# Patient Record
Sex: Female | Born: 1995
Health system: Southern US, Community
[De-identification: ages and names within clinical notes are randomized; demographics above are authoritative.]

## PROBLEM LIST (undated history)

## (undated) DIAGNOSIS — H5213 Myopia, bilateral: Secondary | ICD-10-CM

## (undated) HISTORY — PX: WISDOM TOOTH EXTRACTION: SHX21

## (undated) HISTORY — DX: Myopia, bilateral: H52.13

## (undated) HISTORY — PX: IUD REMOVAL: SHX5392

---

## 2017-07-12 DIAGNOSIS — K080 Exfoliation of teeth due to systemic causes: Secondary | ICD-10-CM | POA: Diagnosis not present

## 2017-11-16 DIAGNOSIS — K010 Embedded teeth: Secondary | ICD-10-CM | POA: Diagnosis not present

## 2018-11-16 DIAGNOSIS — K080 Exfoliation of teeth due to systemic causes: Secondary | ICD-10-CM | POA: Diagnosis not present

## 2019-05-16 ENCOUNTER — Ambulatory Visit: Payer: Federal, State, Local not specified - PPO | Admitting: Family Medicine

## 2019-05-16 ENCOUNTER — Other Ambulatory Visit: Payer: Self-pay

## 2019-05-16 ENCOUNTER — Encounter: Payer: Self-pay | Admitting: Family Medicine

## 2019-05-16 VITALS — BP 112/79 | HR 78 | Temp 98.2°F | Resp 16 | Ht 63.5 in | Wt 115.0 lb

## 2019-05-16 DIAGNOSIS — L309 Dermatitis, unspecified: Secondary | ICD-10-CM | POA: Diagnosis not present

## 2019-05-16 DIAGNOSIS — Z23 Encounter for immunization: Secondary | ICD-10-CM | POA: Diagnosis not present

## 2019-05-16 DIAGNOSIS — Z0000 Encounter for general adult medical examination without abnormal findings: Secondary | ICD-10-CM | POA: Diagnosis not present

## 2019-05-16 MED ORDER — TRIAMCINOLONE ACETONIDE 0.5 % EX OINT: 1.0000 "application " | TOPICAL_OINTMENT | Freq: Two times a day (BID) | CUTANEOUS | 0 refills | Status: DC

## 2019-05-16 NOTE — Patient Instructions (Addendum)
Health Maintenance, Female Adopting a healthy lifestyle and getting preventive care can go a long way to promote health and wellness. Talk with your health care provider about what schedule of regular examinations is right for you. This is a good chance for you to check in with your provider about disease prevention and staying healthy. In between checkups, there are plenty of things you can do on your own. Experts have done a lot of research about which lifestyle changes and preventive measures are most likely to keep you healthy. Ask your health care provider for more information. Weight and diet Eat a healthy diet  Be sure to include plenty of vegetables, fruits, low-fat dairy products, and lean protein.  Do not eat a lot of foods high in solid fats, added sugars, or salt.  Get regular exercise. This is one of the most important things you can do for your health. ? Most adults should exercise for at least 150 minutes each week. The exercise should increase your heart rate and make you sweat (moderate-intensity exercise). ? Most adults should also do strengthening exercises at least twice a week. This is in addition to the moderate-intensity exercise. Maintain a healthy weight  Body mass index (BMI) is a measurement that can be used to identify possible weight problems. It estimates body fat based on height and weight. Your health care provider can help determine your BMI and help you achieve or maintain a healthy weight.  For females 20 years of age and older: ? A BMI below 18.5 is considered underweight. ? A BMI of 18.5 to 24.9 is normal. ? A BMI of 25 to 29.9 is considered overweight. ? A BMI of 30 and above is considered obese. Watch levels of cholesterol and blood lipids  You should start having your blood tested for lipids and cholesterol at 20 years of age, then have this test every 5 years.  You may need to have your cholesterol levels checked more often if: ? Your lipid or  cholesterol levels are high. ? You are older than 21 years of age. ? You are at high risk for heart disease. Cancer screening Lung Cancer  Lung cancer screening is recommended for adults 55-80 years old who are at high risk for lung cancer because of a history of smoking.  A yearly low-dose CT scan of the lungs is recommended for people who: ? Currently smoke. ? Have quit within the past 15 years. ? Have at least a 30-pack-year history of smoking. A pack year is smoking an average of one pack of cigarettes a day for 1 year.  Yearly screening should continue until it has been 15 years since you quit.  Yearly screening should stop if you develop a health problem that would prevent you from having lung cancer treatment. Breast Cancer  Practice breast self-awareness. This means understanding how your breasts normally appear and feel.  It also means doing regular breast self-exams. Let your health care provider know about any changes, no matter how small.  If you are in your 20s or 30s, you should have a clinical breast exam (CBE) by a health care provider every 1-3 years as part of a regular health exam.  If you are 40 or older, have a CBE every year. Also consider having a breast X-ray (mammogram) every year.  If you have a family history of breast cancer, talk to your health care provider about genetic screening.  If you are at high risk for breast cancer, talk   to your health care provider about having an MRI and a mammogram every year.  Breast cancer gene (BRCA) assessment is recommended for women who have family members with BRCA-related cancers. BRCA-related cancers include: ? Breast. ? Ovarian. ? Tubal. ? Peritoneal cancers.  Results of the assessment will determine the need for genetic counseling and BRCA1 and BRCA2 testing. Cervical Cancer Your health care provider may recommend that you be screened regularly for cancer of the pelvic organs (ovaries, uterus, and vagina).  This screening involves a pelvic examination, including checking for microscopic changes to the surface of your cervix (Pap test). You may be encouraged to have this screening done every 3 years, beginning at age 21.  For women ages 30-65, health care providers may recommend pelvic exams and Pap testing every 3 years, or they may recommend the Pap and pelvic exam, combined with testing for human papilloma virus (HPV), every 5 years. Some types of HPV increase your risk of cervical cancer. Testing for HPV may also be done on women of any age with unclear Pap test results.  Other health care providers may not recommend any screening for nonpregnant women who are considered low risk for pelvic cancer and who do not have symptoms. Ask your health care provider if a screening pelvic exam is right for you.  If you have had past treatment for cervical cancer or a condition that could lead to cancer, you need Pap tests and screening for cancer for at least 20 years after your treatment. If Pap tests have been discontinued, your risk factors (such as having a new sexual partner) need to be reassessed to determine if screening should resume. Some women have medical problems that increase the chance of getting cervical cancer. In these cases, your health care provider may recommend more frequent screening and Pap tests. Colorectal Cancer  This type of cancer can be detected and often prevented.  Routine colorectal cancer screening usually begins at 21 years of age and continues through 21 years of age.  Your health care provider may recommend screening at an earlier age if you have risk factors for colon cancer.  Your health care provider may also recommend using home test kits to check for hidden blood in the stool.  A small camera at the end of a tube can be used to examine your colon directly (sigmoidoscopy or colonoscopy). This is done to check for the earliest forms of colorectal cancer.  Routine  screening usually begins at age 50.  Direct examination of the colon should be repeated every 5-10 years through 21 years of age. However, you may need to be screened more often if early forms of precancerous polyps or small growths are found. Skin Cancer  Check your skin from head to toe regularly.  Tell your health care provider about any new moles or changes in moles, especially if there is a change in a mole's shape or color.  Also tell your health care provider if you have a mole that is larger than the size of a pencil eraser.  Always use sunscreen. Apply sunscreen liberally and repeatedly throughout the day.  Protect yourself by wearing long sleeves, pants, a wide-brimmed hat, and sunglasses whenever you are outside. Heart disease, diabetes, and high blood pressure  High blood pressure causes heart disease and increases the risk of stroke. High blood pressure is more likely to develop in: ? People who have blood pressure in the high end of the normal range (130-139/85-89 mm Hg). ? People   who are overweight or obese. ? People who are African American.  If you are 84-22 years of age, have your blood pressure checked every 3-5 years. If you are 67 years of age or older, have your blood pressure checked every year. You should have your blood pressure measured twice-once when you are at a hospital or clinic, and once when you are not at a hospital or clinic. Record the average of the two measurements. To check your blood pressure when you are not at a hospital or clinic, you can use: ? An automated blood pressure machine at a pharmacy. ? A home blood pressure monitor.  If you are between 52 years and 3 years old, ask your health care provider if you should take aspirin to prevent strokes.  Have regular diabetes screenings. This involves taking a blood sample to check your fasting blood sugar level. ? If you are at a normal weight and have a low risk for diabetes, have this test once  every three years after 20 years of age. ? If you are overweight and have a high risk for diabetes, consider being tested at a younger age or more often. Preventing infection Hepatitis B  If you have a higher risk for hepatitis B, you should be screened for this virus. You are considered at high risk for hepatitis B if: ? You were born in a country where hepatitis B is common. Ask your health care provider which countries are considered high risk. ? Your parents were born in a high-risk country, and you have not been immunized against hepatitis B (hepatitis B vaccine). ? You have HIV or AIDS. ? You use needles to inject street drugs. ? You live with someone who has hepatitis B. ? You have had sex with someone who has hepatitis B. ? You get hemodialysis treatment. ? You take certain medicines for conditions, including cancer, organ transplantation, and autoimmune conditions. Hepatitis C  Blood testing is recommended for: ? Everyone born from [birth year] through 1963. ? Anyone with known risk factors for hepatitis C. Sexually transmitted infections (STIs)  You should be screened for sexually transmitted infections (STIs) including gonorrhea and chlamydia if: ? You are sexually active and are younger than 21 years of age. ? You are older than 21 years of age and your health care provider tells you that you are at risk for this type of infection. ? Your sexual activity has changed since you were last screened and you are at an increased risk for chlamydia or gonorrhea. Ask your health care provider if you are at risk.  If you do not have HIV, but are at risk, it may be recommended that you take a prescription medicine daily to prevent HIV infection. This is called pre-exposure prophylaxis (PrEP). You are considered at risk if: ? You are sexually active and do not regularly use condoms or know the HIV status of your partner(s). ? You take drugs by injection. ? You are sexually active with a partner  who has HIV. Talk with your health care provider about whether you are at high risk of being infected with HIV. If you choose to begin PrEP, you should first be tested for HIV. You should then be tested every 3 months for as long as you are taking PrEP. Pregnancy  If you are premenopausal and you may become pregnant, ask your health care provider about preconception counseling.  If you may become pregnant, take 400 to 800 micrograms (mcg) of folic acid every  day.  If you want to prevent pregnancy, talk to your health care provider about birth control (contraception). Osteoporosis and menopause  Osteoporosis is a disease in which the bones lose minerals and strength with aging. This can result in serious bone fractures. Your risk for osteoporosis can be identified using a bone density scan.  If you are 3 years of age or older, or if you are at risk for osteoporosis and fractures, ask your health care provider if you should be screened.  Ask your health care provider whether you should take a calcium or vitamin D supplement to lower your risk for osteoporosis.  Menopause may have certain physical symptoms and risks.  Hormone replacement therapy may reduce some of these symptoms and risks. Talk to your health care provider about whether hormone replacement therapy is right for you. Follow these instructions at home:  Schedule regular health, dental, and eye exams.  Stay current with your immunizations.  Do not use any tobacco products including cigarettes, chewing tobacco, or electronic cigarettes.  If you are pregnant, do not drink alcohol.  If you are breastfeeding, limit how much and how often you drink alcohol.  Limit alcohol intake to no more than 1 drink per day for nonpregnant women. One drink equals 12 ounces of beer, 5 ounces of wine, or 1 ounces of hard liquor.  Do not use street drugs.  Do not share needles.  Ask your health care provider for help if you need support  or information about quitting drugs.  Tell your health care provider if you often feel depressed.  Tell your health care provider if you have ever been abused or do not feel safe at home. This information is not intended to replace advice given to you by your health care provider. Make sure you discuss any questions you have with your health care provider. Document Released: 06/01/2011 Document Revised: 04/23/2016 Document Reviewed: 08/20/2015 Elsevier Interactive Patient Education  2019 [place] Sex Practicing safe sex means taking steps before and during sex to reduce your risk of:  Getting an STD (sexually transmitted disease).  Giving your partner an STD.  Unwanted pregnancy. How can I practice safe sex? To practice safe sex:  Limit your sexual partners to only one partner who is having sex with only you.  Avoid using alcohol and recreational drugs before having sex. These substances can affect your judgment.  Before having sex with a new partner: ? Talk to your partner about past partners, past STDs, and drug use. ? You and your partner should be screened for STDs and discuss the results with each other.  Check your body regularly for sores, blisters, rashes, or unusual discharge. If you notice any of these problems, visit your health care provider.  If you have symptoms of an infection or you are being treated for an STD, avoid sexual contact.  While having sex, use a condom. Make sure to: ? Use a condom every time you have vaginal, oral, or anal sex. Both females and males should wear condoms during oral sex. ? Keep condoms in place from the beginning to the end of sexual activity. ? Use a latex condom, if possible. Latex condoms offer the best protection. ? Use only water-based lubricants or oils to lubricate a condom. Using petroleum-based lubricants or oils will weaken the condom and increase the chance that it will break.  See your health care provider  for regular screenings, exams, and tests for STDs.  Talk with  your health care provider about the form of birth control (contraception) that is best for you.  Get vaccinated against hepatitis B and human papillomavirus (HPV).  If you are at risk of being infected with HIV (human immunodeficiency virus), talk with your health care provider about taking a prescription medicine to prevent HIV infection. You are considered at risk for HIV if: ? You are a man who has sex with other men. ? You are a heterosexual man or woman who is sexually active with more than one partner. ? You take drugs by injection. ? You are sexually active with a partner who has HIV. This information is not intended to replace advice given to you by your health care provider. Make sure you discuss any questions you have with your health care provider. Document Released: 12/24/2004 Document Revised: 04/01/2016 Document Reviewed: 10/06/2015 Elsevier Interactive Patient Education  2019 [employer].

## 2019-05-16 NOTE — Progress Notes (Signed)
Patient ID: Martha Small, female  DOB: [birth year], 23 y.o.   MRN: [id number] Patient Care Team    Relationship Specialty Notifications Start End  Ma Hillock, DO PCP - General Family Medicine  05/16/19   Rutherford Guys, MD Consulting Physician Ophthalmology  05/16/19     Chief Complaint  Patient presents with  . Establish Care    [facility] peds previous PCP. Pt starts parademic school in the fall at [employer] and needs CPE to complete forms  Pt does not have forms yet from university    Subjective:  Martha Small is a 23 y.o.  female present for new patient establishment. All past medical history, surgical history, allergies, family history, immunizations, medications and social history were updated in the electronic medical record today. All recent labs, ED visits and hospitalizations within the last year were reviewed. Patient reports she has been on birth control pill supplied by her campus health clinic.  She has had STD screening completed at her campus as well. She reports her menstrual cycles are regular, once monthly on birth control pill her menses last approximately 4 days and is regular.  She has been sexually active, but not currently sexually active.  She reports a dry scaly itchy rash located on her left nipple.  She has tried putting different creams on the area without any benefit.  The patch remains without becoming worse or better.  It has been present for approximately 4 months.  Her mother has a history of breast cancer at age 68.  Patient does not recall the type of breast cancer her mother had.  Health maintenance:  Colonoscopy: no fhx, routine screen at 78 Mammogram: Mother breast CA at 5- early screening recommended.  Cervical cancer screening: PAP to start at 23. BCP use (provided by school's health clinic). Has been SA, but not currently.  Immunizations: tdap updated today, Influenza (encouraged yearly) Infectious disease screening: HIV declined,  urine cytology (STD screen) completed at her [facility] on campus where she gets her birth control. Assistive device: none Oxygen use: none Patient has a Dental home. Hospitalizations/ED visits:reviewed  Depression screen [facility] 2/9 05/16/2019  Decreased Interest 0  Down, Depressed, Hopeless 0  PHQ - 2 Score 0   No flowsheet data found.    No flowsheet data found.   Immunization History  Administered Date(s) Administered  . DTaP 12/23/1998, 02/21/1999, 04/30/1999, 01/26/2000, 12/20/2002  . Hepatitis A 12/11/2005, 01/21/2007  . Hepatitis B 04/30/1999, 07/23/1999, 10/29/1999  . HiB (PRP-OMP) 12/23/1998, 02/21/1999, 04/30/1999, 01/26/2000  . Hpv 05/20/2010, 06/30/2011, 07/07/2012  . IPV 12/23/1998, 02/21/1999, 07/23/1999, 12/20/2002  . MMR 10/29/1999, 12/20/2002  . Meningococcal Conjugate 05/20/2010  . Meningococcal Polysaccharide 07/17/2015  . Pneumococcal Conjugate-13 01/26/2000, 04/26/2000  . Td 05/20/2010, 05/16/2019  . Tdap 05/20/2010  . Varicella 10/29/1999, 01/21/2007     Hearing Screening   125Hz  250Hz  500Hz  1000Hz  2000Hz  3000Hz  4000Hz  6000Hz  8000Hz   Right ear:           Left ear:             Visual Acuity Screening   Right eye Left eye Both eyes  Without correction:     With correction: 20/25 20/25 20/25     Past Medical History:  Diagnosis Date  . History of frequent urinary tract infections   . Myopia of both eyes    No Known Allergies Past Surgical History:  Procedure Laterality Date  . WISDOM TOOTH EXTRACTION     Family History  Problem  Relation Age of Onset  . Breast cancer Mother 57  . Hypertension Mother   . Skin cancer Mother   . Hyperlipidemia Father   . Hypertension Father   . Arthritis Maternal Grandmother   . Skin cancer Maternal Grandfather   . Arthritis Paternal Grandmother   . Prostate cancer Paternal Grandfather   . Diabetes Paternal Grandfather   . Hypertension Paternal Grandfather   . Hyperlipidemia Paternal Grandfather   .  Heart disease Paternal Grandfather    Social History   Social History Narrative   Marital status/children/pets: Single.    Education/employment: Electronics engineer.    Safety:      -Wears a bicycle helmet riding a bike: Yes     -smoke alarm in the home:Yes     - wears seatbelt: Yes     - Feels safe in their relationships: Yes    Allergies as of 05/16/2019   No Known Allergies     Medication List       Accurate as of May 16, 2019 12:47 PM. If you have any questions, ask your nurse or doctor.        Tri-Previfem 0.18/0.215/0.25 MG-35 MCG tablet Generic drug: Norgestimate-Ethinyl Estradiol Triphasic   triamcinolone ointment 0.5 % Commonly known as: KENALOG Apply 1 application topically 2 (two) times daily. Started by: Howard Pouch, DO       All past medical history, surgical history, allergies, family history, immunizations andmedications were updated in the EMR today and reviewed under the history and medication portions of their EMR.    No results found for this or any previous visit (from the past 2160 hour(s)).  Patient was never admitted.   ROS: 14 pt review of systems performed and negative (unless mentioned in an HPI)  Objective: BP 112/79 (BP Location: Left Arm, Patient Position: Sitting, Cuff Size: Normal)   Pulse 78   Temp 98.2 F (36.8 C) (Oral)   Resp 16   Ht 5' 3.5" (1.613 m)   Wt 115 lb (52.2 kg)   LMP 04/25/2019 (Exact Date)   SpO2 99%   BMI 20.05 kg/m  Gen: Afebrile. No acute distress. Nontoxic in appearance, well-developed, well-nourished, nontoxic, pleasant, Caucasian female. HENT: AT. [place]. Bilateral TM visualized and normal in appearance, normal external auditory canal. MMM, no oral lesions, adequate dentition. Bilateral nares within normal limits. Throat without erythema, ulcerations or exudates.  No cough on exam, no  hoarseness on exam. Eyes:Pupils Equal Round Reactive to light, Extraocular movements intact,  Conjunctiva without redness,  discharge or icterus. Neck/lymp/endocrine: Supple, no lymphadenopathy, no thyromegaly CV: RRR no murmur, no edema, +2/4 P posterior tibialis pulses.  Chest: CTAB, no wheeze, rhonchi or crackles.  Normal respiratory effort.  Good air movement. Abd: Soft.  Flat. NTND. BS present.  No masses palpated. No hepatosplenomegaly. No rebound tenderness or guarding. Skin: Dime size patch of scaly skin left areolar 12 o'clock position., purpura or petechiae. Warm and well-perfused. Skin intact. Neuro/Msk: Normal gait. PERLA. EOMi. Alert. Oriented x3.  Cranial nerves II through XII intact. Muscle strength 5/5 upper/lower extremity. DTRs equal bilaterally. Psych: Normal affect, dress and demeanor. Normal speech. Normal thought content and judgment.   Hearing Screening   125Hz  250Hz  500Hz  1000Hz  2000Hz  3000Hz  4000Hz  6000Hz  8000Hz   Right ear:           Left ear:             Visual Acuity Screening   Right eye Left eye Both eyes  Without correction:  With correction: 20/25 20/25 20/25      Assessment/plan: Annaelle Kasel is a 23 y.o. female present for establish care Encounter for preventive health examination Patient was encouraged to exercise greater than 150 minutes a week. Patient was encouraged to choose a diet filled with fresh fruits and vegetables, and lean meats. AVS provided to patient today for education/recommendation on gender specific health and safety maintenance. Body mass index is 20.05 kg/m.  Colonoscopy: no fhx, routine screen at 73 Mammogram: Mother breast CA at 46- early screening recommended.  Cervical cancer screening: PAP to start at 23. BCP use (provided by school's health clinic). Has been SA, but not currently.  Immunizations: tdap updated today, Influenza (encouraged yearly) Infectious disease screening: HIV declined, urine cytology (STD screen) completed at her [facility] clinic on campus where she gets her birth control. - Visual acuity screening Need for Td vaccine -  Td vaccine greater than or equal to 7yo preservative free IM Eczema, unspecified type - rash at 12 o'clock position of left areole or region present for 4 months.  Discussed differential diagnosis with patient today either bacterial, fungal or inflammatory versus less likely cancerous causes for her age.  However her mother did have a history of breast cancer at age 77. -All of triamcinolone ointment for 2 weeks, if not seeing improvement patient is to follow-up and we will refer to gynecology if appropriate.    Return in about 1 year (around 05/15/2020) for CPE (30 min).   Note is dictated utilizing voice recognition software. Although note has been proof read prior to signing, occasional typographical errors still can be missed. If any questions arise, please do not hesitate to call for verification.  Electronically signed by: Howard Pouch, DO [place]

## 2019-06-08 ENCOUNTER — Telehealth: Payer: Self-pay | Admitting: Family Medicine

## 2019-06-08 NOTE — Telephone Encounter (Signed)
Pt had CPE 05/20/2019. Pt needs a two step TB test and has forms she needs to drop off that need to be filled out. Pt was told depending on what was on the forms she may need to come back. She verbalized understanding.

## 2019-06-08 NOTE — Telephone Encounter (Signed)
Needs TB skin test for school. Does not have form to complete. It needs to be under skin test, checked and then test repeated. Is this something we can do?  Office visit or no office visit?  She is also inquiring about a form we would complete saying the test was negative or positive.  Please advise and contact.  [phone]

## 2019-06-09 ENCOUNTER — Telehealth: Payer: Self-pay

## 2019-06-09 NOTE — Telephone Encounter (Signed)
She can come in Monday- have it read 48-72 hours later. 2nd PPD 7 days after first and read 48-72 hours later.

## 2019-06-09 NOTE — Telephone Encounter (Signed)
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 Pt wants to come Monday morning for first TB test since she does have to have two done. NP/CPE done on 05/16/2019. Please advise if this is okay and how long she has to wait between each TB test.

## 2019-06-12 ENCOUNTER — Telehealth: Payer: Self-pay

## 2019-06-12 ENCOUNTER — Other Ambulatory Visit: Payer: Self-pay

## 2019-06-12 ENCOUNTER — Ambulatory Visit (INDEPENDENT_AMBULATORY_CARE_PROVIDER_SITE_OTHER): Payer: Federal, State, Local not specified - PPO

## 2019-06-12 DIAGNOSIS — Z111 Encounter for screening for respiratory tuberculosis: Secondary | ICD-10-CM | POA: Diagnosis not present

## 2019-06-12 NOTE — Telephone Encounter (Signed)
Returned form to LPN. No attachment was with form as it stated on the sheet.  Return after form is supplied and her 2 step PPD is read.

## 2019-06-12 NOTE — Telephone Encounter (Signed)
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 Placed of Dr Raoul Pitch desk for signature. Pt did get 1 out of 2 step TB test this AM. Last OV/CPE was 05/16/2019.

## 2019-06-12 NOTE — Telephone Encounter (Signed)
Martha Small. Please let pt know when she comes today to get TB test.  Thanks

## 2019-06-12 NOTE — Telephone Encounter (Signed)
Noted.  Patient scheduled 2nd tb read at her visit today.

## 2019-06-15 ENCOUNTER — Ambulatory Visit: Payer: Federal, State, Local not specified - PPO

## 2019-06-15 ENCOUNTER — Other Ambulatory Visit: Payer: Self-pay

## 2019-06-15 LAB — TB SKIN TEST
Induration: 0 mm
TB Skin Test: NEGATIVE

## 2019-06-16 NOTE — Telephone Encounter (Signed)
Attachment was placed with form and put on Dr Dierdre Highman desk.

## 2019-06-19 NOTE — Telephone Encounter (Signed)
Called patient. She will come by and pick up the signed paper on Wednesday.  Paperwork has been placed up front.

## 2019-06-19 NOTE — Telephone Encounter (Signed)
Completed and placed on assistants desk.

## 2019-06-21 ENCOUNTER — Ambulatory Visit (INDEPENDENT_AMBULATORY_CARE_PROVIDER_SITE_OTHER): Payer: Federal, State, Local not specified - PPO

## 2019-06-21 ENCOUNTER — Other Ambulatory Visit: Payer: Self-pay

## 2019-06-21 DIAGNOSIS — Z111 Encounter for screening for respiratory tuberculosis: Secondary | ICD-10-CM | POA: Diagnosis not present

## 2019-06-21 DIAGNOSIS — Z227 Latent tuberculosis: Secondary | ICD-10-CM

## 2019-06-21 NOTE — Progress Notes (Addendum)
Please sign off on PPD in PCP absence, thanks.  PPD result = ZERO mm.  Signed:  Crissie Sickles, MD           06/21/2019

## 2019-06-23 LAB — TB SKIN TEST
Induration: 0 mm
TB Skin Test: NEGATIVE

## 2019-07-04 ENCOUNTER — Ambulatory Visit (INDEPENDENT_AMBULATORY_CARE_PROVIDER_SITE_OTHER): Payer: Federal, State, Local not specified - PPO | Admitting: Family Medicine

## 2019-07-04 ENCOUNTER — Telehealth: Payer: Self-pay

## 2019-07-04 ENCOUNTER — Other Ambulatory Visit: Payer: Self-pay

## 2019-07-04 ENCOUNTER — Encounter: Payer: Self-pay | Admitting: Family Medicine

## 2019-07-04 ENCOUNTER — Ambulatory Visit (INDEPENDENT_AMBULATORY_CARE_PROVIDER_SITE_OTHER): Payer: Federal, State, Local not specified - PPO

## 2019-07-04 VITALS — Temp 98.4°F

## 2019-07-04 DIAGNOSIS — Z0184 Encounter for antibody response examination: Secondary | ICD-10-CM | POA: Diagnosis not present

## 2019-07-04 DIAGNOSIS — Z1159 Encounter for screening for other viral diseases: Secondary | ICD-10-CM

## 2019-07-04 NOTE — Progress Notes (Signed)
   VIRTUAL VISIT VIA VIDEO  I connected with Martha Small on 07/04/19 at 10:00 AM EDT by a video enabled telemedicine application and verified that I am speaking with the correct person using two identifiers. Location patient: Home Location provider: [place], Office Persons participating in the virtual visit: Patient, Dr. Kuneff and R.Baker, LPN  I discussed the limitations of evaluation and management by telemedicine and the availability of in person appointments. The patient expressed understanding and agreed to proceed.   SUBJECTIVE Chief Complaint  Patient presents with  . titers    Pt needs titers drawn for school- MMR, chicken pox, Hep B     HPI: Martha Small is a 22 y.o. female present for immunity status testing.  She is leaving for school on Friday and despite having records of having hepatitis B vaccines x3, varicella vaccinesx2 and MMR vaccinesx2 for school is asking her to have titers drawn of each.  ROS: See pertinent positives and negatives per HPI.  Patient Active Problem List   Diagnosis Date Noted  . Eczema 05/16/2019    Social History   Tobacco Use  . Smoking status: Never Smoker  . Smokeless tobacco: Never Used  Substance Use Topics  . Alcohol use: Never    Frequency: Never    Current Outpatient Medications:  .  Norgestimate-Ethinyl Estradiol Triphasic (TRI-PREVIFEM) 0.18/0.215/0.25 MG-35 MCG tablet, , Disp: , Rfl:  .  triamcinolone ointment (KENALOG) 0.5 %, Apply 1 application topically 2 (two) times daily., Disp: 30 g, Rfl: 0  No Known Allergies  OBJECTIVE: Temp 98.4 F (36.9 C) (Temporal)  Gen: No acute distress. Nontoxic in appearance.  HENT: AT. [place].  MMM.  Eyes:Pupils Equal Round Reactive to light, Extraocular movements intact,  Conjunctiva without redness, discharge or icterus. Chest: Cough or shortness of breath not present  Neuro:  Alert. Oriented x3   ASSESSMENT AND PLAN: Martha Small is a 22 y.o. female present  for  Immunity status testing/hepatitis testing -Patient will be scheduled for lab this afternoon in hopes to get all reports back in time to be sent to her school by Friday. - Measles/Mumps/Rubella Immunity; Future - Varicella zoster antibody, IgG; Future - Hbs and Hbsag future.  - results will be posted to mychart and if desired can be emailed to patient.    > 15 minutes spent with patient, > 50% of that time face to face    Renee Kuneff, DO 07/04/2019   

## 2019-07-04 NOTE — Telephone Encounter (Signed)
Pt left VM asking if she can schedule lab appt to have her titers checked for MMR, chicken pox, and  Hep C for school. 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 She is able to do a VV and then come in to just have the labs completed. Please advise if we can do this today.

## 2019-07-05 LAB — VARICELLA ZOSTER ANTIBODY, IGG: Varicella IgG: 185.7 index

## 2019-07-05 LAB — MEASLES/MUMPS/RUBELLA IMMUNITY
Mumps IgG: 12.7 AU/mL
Rubella: <0.9 index — ABNORMAL LOW
Rubeola IgG: 37.7 AU/mL

## 2019-07-05 LAB — HEPATITIS B SURFACE ANTIGEN: Hepatitis B Surface Ag: NONREACTIVE

## 2019-07-05 LAB — HEPATITIS B SURFACE ANTIBODY,QUALITATIVE: Hep B S Ab: NONREACTIVE

## 2019-07-06 ENCOUNTER — Other Ambulatory Visit: Payer: Self-pay

## 2019-07-06 ENCOUNTER — Ambulatory Visit (INDEPENDENT_AMBULATORY_CARE_PROVIDER_SITE_OTHER): Payer: Federal, State, Local not specified - PPO

## 2019-07-06 DIAGNOSIS — Z23 Encounter for immunization: Secondary | ICD-10-CM

## 2019-07-06 DIAGNOSIS — Z9229 Personal history of other drug therapy: Secondary | ICD-10-CM

## 2019-07-06 NOTE — Progress Notes (Addendum)
Martha Small 23 y.o. female presents to office today for Hep B and MMR vaccine. Orders per PCP, Renee Kuneff, DO. Administered ENGERIX-B 1 mL IM right arm. Administered MMR 0.5 mL SubCut left arm. Patient tolerated well. Stated that she will continue her Hep B series at [employer].   Medical screening examination/treatment/procedure(s) were performed by non-physician practitioner and as supervising physician I was immediately available for consultation/collaboration.  I agree with above assessment and plan.  Electronically Signed by: Howard Pouch, DO [place] [facility] [place]

## 2019-07-20 ENCOUNTER — Telehealth: Payer: Self-pay

## 2019-07-20 NOTE — Telephone Encounter (Signed)
Pt called and LM on nurse line needing the date her TB test was given and the date that it was read. The 06/21/19 TB test and letter is correct but the 05/1319 TB test and read date is not correct. Pt needs this corrected and sent to her.

## 2019-07-21 ENCOUNTER — Encounter: Payer: Self-pay | Admitting: Family Medicine

## 2019-07-21 NOTE — Telephone Encounter (Signed)
Letter was fixed and emailed to pt. Pt was called, email confirmed. Pt verbalized understanding

## 2019-12-08 ENCOUNTER — Other Ambulatory Visit: Payer: Self-pay

## 2019-12-11 ENCOUNTER — Encounter: Payer: Self-pay | Admitting: Women's Health

## 2019-12-11 ENCOUNTER — Other Ambulatory Visit: Payer: Self-pay

## 2019-12-11 ENCOUNTER — Ambulatory Visit (INDEPENDENT_AMBULATORY_CARE_PROVIDER_SITE_OTHER): Payer: Federal, State, Local not specified - PPO | Admitting: Women's Health

## 2019-12-11 VITALS — BP 118/76 | Ht 62.0 in | Wt 111.0 lb

## 2019-12-11 DIAGNOSIS — Z01419 Encounter for gynecological examination (general) (routine) without abnormal findings: Secondary | ICD-10-CM

## 2019-12-11 MED ORDER — NORGESTIM-ETH ESTRAD TRIPHASIC 0.18/0.215/0.25 MG-35 MCG PO TABS: 1.0000 | ORAL_TABLET | Freq: Every day | ORAL | 4 refills | Status: DC

## 2019-12-11 NOTE — Patient Instructions (Signed)
Good to meet you! MVI daily Health Maintenance, Female Adopting a healthy lifestyle and getting preventive care are important in promoting health and wellness. Ask your health care provider about:  The right schedule for you to have regular tests and exams.  Things you can do on your own to prevent diseases and keep yourself healthy. What should I know about diet, weight, and exercise? Eat a healthy diet   Eat a diet that includes plenty of vegetables, fruits, low-fat dairy products, and lean protein.  Do not eat a lot of foods that are high in solid fats, added sugars, or sodium. Maintain a healthy weight Body mass index (BMI) is used to identify weight problems. It estimates body fat based on height and weight. Your health care provider can help determine your BMI and help you achieve or maintain a healthy weight. Get regular exercise Get regular exercise. This is one of the most important things you can do for your health. Most adults should:  Exercise for at least 150 minutes each week. The exercise should increase your heart rate and make you sweat (moderate-intensity exercise).  Do strengthening exercises at least twice a week. This is in addition to the moderate-intensity exercise.  Spend less time sitting. Even light physical activity can be beneficial. Watch cholesterol and blood lipids Have your blood tested for lipids and cholesterol at 22 years of age, then have this test every 5 years. Have your cholesterol levels checked more often if:  Your lipid or cholesterol levels are high.  You are older than 22 years of age.  You are at high risk for heart disease. What should I know about cancer screening? Depending on your health history and family history, you may need to have cancer screening at various ages. This may include screening for:  Breast cancer.  Cervical cancer.  Colorectal cancer.  Skin cancer.  Lung cancer. What should I know about heart disease,  diabetes, and high blood pressure? Blood pressure and heart disease  High blood pressure causes heart disease and increases the risk of stroke. This is more likely to develop in people who have high blood pressure readings, are of African descent, or are overweight.  Have your blood pressure checked: ? Every 3-5 years if you are 18-39 years of age. ? Every year if you are 40 years old or older. Diabetes Have regular diabetes screenings. This checks your fasting blood sugar level. Have the screening done:  Once every three years after age 40 if you are at a normal weight and have a low risk for diabetes.  More often and at a younger age if you are overweight or have a high risk for diabetes. What should I know about preventing infection? Hepatitis B If you have a higher risk for hepatitis B, you should be screened for this virus. Talk with your health care provider to find out if you are at risk for hepatitis B infection. Hepatitis C Testing is recommended for:  Everyone born from 1945 through 1965.  Anyone with known risk factors for hepatitis C. Sexually transmitted infections (STIs)  Get screened for STIs, including gonorrhea and chlamydia, if: ? You are sexually active and are younger than 22 years of age. ? You are older than 22 years of age and your health care provider tells you that you are at risk for this type of infection. ? Your sexual activity has changed since you were last screened, and you are at increased risk for chlamydia or gonorrhea.   gonorrhea. Ask your health care provider if you are at risk.  Ask your health care provider about whether you are at high risk for HIV. Your health care provider may recommend a prescription medicine to help prevent HIV infection. If you choose to take medicine to prevent HIV, you should first get tested for HIV. You should then be tested every 3 months for as long as you are taking the medicine. Pregnancy  If you are about to stop having your  period (premenopausal) and you may become pregnant, seek counseling before you get pregnant.  Take 400 to 800 micrograms (mcg) of folic acid every day if you become pregnant.  Ask for birth control (contraception) if you want to prevent pregnancy. Osteoporosis and menopause Osteoporosis is a disease in which the bones lose minerals and strength with aging. This can result in bone fractures. If you are 65 years old or older, or if you are at risk for osteoporosis and fractures, ask your health care provider if you should:  Be screened for bone loss.  Take a calcium or vitamin D supplement to lower your risk of fractures.  Be given hormone replacement therapy (HRT) to treat symptoms of menopause. Follow these instructions at home: Lifestyle  Do not use any products that contain nicotine or tobacco, such as cigarettes, e-cigarettes, and chewing tobacco. If you need help quitting, ask your health care provider.  Do not use street drugs.  Do not share needles.  Ask your health care provider for help if you need support or information about quitting drugs. Alcohol use  Do not drink alcohol if: ? Your health care provider tells you not to drink. ? You are pregnant, may be pregnant, or are planning to become pregnant.  If you drink alcohol: ? Limit how much you use to 0-1 drink a day. ? Limit intake if you are breastfeeding.  Be aware of how much alcohol is in your drink. In the [place] one drink equals one 12 oz bottle of beer (355 mL), one 5 oz glass of wine (148 mL), or one 1 oz glass of hard liquor (44 mL). General instructions  Schedule regular health, dental, and eye exams.  Stay current with your vaccines.  Tell your health care provider if: ? You often feel depressed. ? You have ever been abused or do not feel safe at home. Summary  Adopting a healthy lifestyle and getting preventive care are important in promoting health and wellness.  Follow your health care provider's  instructions about healthy diet, exercising, and getting tested or screened for diseases.  Follow your health care provider's instructions on monitoring your cholesterol and blood pressure. This information is not intended to replace advice given to you by your health care provider. Make sure you discuss any questions you have with your health care provider. Document Revised: 11/09/2018 Document Reviewed: 11/09/2018 Elsevier Patient Education  2020 Elsevier Inc.  

## 2019-12-11 NOTE — Progress Notes (Signed)
Martha Small [birth year] [id number]    History: 24 yo SWF G0 presents for annual exam. Monthly cycle on Tri-Previfem. Not sexually active, denies need for STD screening. Sexually active 1st partner 3 years ago, none since. Received Gardasil. Denies vaginal spotting, dysmenorrhea, menorrhagia, itching, burning.   Past medical history, past surgical history, family history and social history were all reviewed and documented in the EPIC chart. 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 Healthy younger sister. Mom breast cancer survivor at 48 years, BRCA negative.   ROS:  A ROS was performed and pertinent positives and negatives are included.   Exam:  Vitals:   12/11/19 1434  BP: 118/76  Weight: 111 lb (50.3 kg)  Height: _0  (1.575 m)   Body mass index is 20.3 kg/m.   General appearance:  Normal Thyroid:  Symmetrical, normal in size, without palpable masses or nodularity. Respiratory  Auscultation:  Clear without wheezing or rhonchi Cardiovascular  Auscultation:  Regular rate, without rubs, murmurs or gallops  Edema/varicosities:  Not grossly evident Abdominal  Soft,nontender, without masses, guarding or rebound.  Liver/spleen:  No organomegaly noted  Hernia:  None appreciated  Skin  Inspection:  Grossly normal   Breasts: Examined lying and sitting.     Right: Without masses, retractions, discharge or axillary adenopathy.    Left: Without masses, retractions, discharge or axillary adenopathy. Gentitourinary   Inguinal/mons:  Normal without inguinal adenopathy  External genitalia:  Normal  BUS/Urethra/Skene's glands:  Normal  Vagina:  Normal  Cervix:  Normal  Uterus:  normal in size, shape and contour.  Midline and mobile  Adnexa/parametria:     Rt: Without masses or tenderness.   Lt: Without masses or tenderness.  Anus and perineum: Normal   Assessment/Plan:  24 y.o. SWF G0 for annual exam with no complaints.   Monthly cycle on  Tri-Previfem[facility]   Plan: Refill for Tri-Sprintec given, slight risk for blood clots and strokes reviewed.  Condoms encouraged if becomes sexually active.  Pap and CBC today. Continue active lifestyle, healthy diet, and daily MVI.     [place], 3:04 PM 12/11/2019

## 2019-12-13 LAB — PAP IG W/ RFLX HPV ASCU

## 2019-12-18 DIAGNOSIS — Z01110 Encounter for hearing examination following failed hearing screening: Secondary | ICD-10-CM | POA: Diagnosis not present

## 2019-12-18 DIAGNOSIS — Z20822 Contact with and (suspected) exposure to covid-19: Secondary | ICD-10-CM | POA: Diagnosis not present

## 2019-12-18 DIAGNOSIS — Z1152 Encounter for screening for COVID-19: Secondary | ICD-10-CM | POA: Diagnosis not present

## 2019-12-18 DIAGNOSIS — Z20828 Contact with and (suspected) exposure to other viral communicable diseases: Secondary | ICD-10-CM | POA: Diagnosis not present

## 2020-01-25 DIAGNOSIS — Z23 Encounter for immunization: Secondary | ICD-10-CM | POA: Diagnosis not present

## 2020-04-22 ENCOUNTER — Other Ambulatory Visit: Payer: Self-pay

## 2020-04-22 ENCOUNTER — Encounter: Payer: Self-pay | Admitting: Nurse Practitioner

## 2020-04-22 ENCOUNTER — Ambulatory Visit: Payer: Federal, State, Local not specified - PPO | Admitting: Nurse Practitioner

## 2020-04-22 VITALS — BP 118/80

## 2020-04-22 DIAGNOSIS — Z803 Family history of malignant neoplasm of breast: Secondary | ICD-10-CM | POA: Diagnosis not present

## 2020-04-22 DIAGNOSIS — N644 Mastodynia: Secondary | ICD-10-CM

## 2020-04-22 DIAGNOSIS — N6311 Unspecified lump in the right breast, upper outer quadrant: Secondary | ICD-10-CM | POA: Diagnosis not present

## 2020-04-22 NOTE — Progress Notes (Signed)
   Acute Office Visit  Subjective:    Patient ID: Martha Small, female    DOB: 19-Apr-1996, 24 y.o.   MRN: [id number]  Chief Complaint  Patient presents with  . breast exam    severity 4    HPI Presents today for right breast lump that she noticed about 2 weeks ago. It is tender to palpation without swelling or redness, denies nipple discharge. Mother with history of breast cancer at age 75, survivor.    Review of Systems  Constitutional: Negative.   Respiratory: Negative.   Cardiovascular: Negative.   Breast: right: tenderness, denies nipple discharge, swelling, or redness Left: normal     Objective:    Physical Exam Constitutional:      Appearance: Normal appearance.  Chest:     Breasts:        Right: Mass (at 11 o'clock) and tenderness (to palpation ) present. No swelling, bleeding, inverted nipple, nipple discharge or skin change.        Left: Normal.    Lymphadenopathy:     Upper Body:     Right upper body: No supraclavicular or axillary adenopathy.     Left upper body: No supraclavicular or axillary adenopathy.     BP 118/80 (BP Location: Right Arm, Patient Position: Sitting, Cuff Size: Normal)   LMP 03/24/2020  Wt Readings from Last 3 Encounters:  12/11/19 111 lb (50.3 kg)  05/16/19 115 lb (52.2 kg)        Assessment & Plan:   Problem List Items Addressed This Visit    None    Visit Diagnoses    Breast pain, right    -  Primary   Breast lump on right side at 11 o'clock position         Plan: Most likely a fibroadenoma. Will schedule a diagnostic ultrasound of right breast due to mother's history of breast cancer.      [name] [facility], 2:31 PM 04/22/2020

## 2020-04-23 ENCOUNTER — Telehealth: Payer: Self-pay | Admitting: *Deleted

## 2020-04-23 DIAGNOSIS — N6310 Unspecified lump in the right breast, unspecified quadrant: Secondary | ICD-10-CM

## 2020-04-23 DIAGNOSIS — N6311 Unspecified lump in the right breast, upper outer quadrant: Secondary | ICD-10-CM

## 2020-04-23 NOTE — Telephone Encounter (Signed)
Patient scheduled at breast center on 04/24/20 @ 12:10pm. Patient informed.

## 2020-04-23 NOTE — Telephone Encounter (Signed)
-----   Message from [name], NP sent at 04/22/2020  2:18 PM EDT ----- Please schedule diagnostic ultrasound for right breast lump. Mother breast cancer at age 22. Thank  you

## 2020-04-24 ENCOUNTER — Ambulatory Visit
Admission: RE | Admit: 2020-04-24 | Discharge: 2020-04-24 | Disposition: A | Discharge status: Home / Self Care | Payer: Federal, State, Local not specified - PPO | Source: Ambulatory Visit | Attending: Nurse Practitioner | Admitting: Nurse Practitioner

## 2020-04-24 ENCOUNTER — Other Ambulatory Visit: Payer: Self-pay

## 2020-04-24 DIAGNOSIS — N6311 Unspecified lump in the right breast, upper outer quadrant: Secondary | ICD-10-CM

## 2020-04-24 DIAGNOSIS — N6489 Other specified disorders of breast: Secondary | ICD-10-CM | POA: Diagnosis not present

## 2020-05-20 ENCOUNTER — Encounter: Payer: Federal, State, Local not specified - PPO | Admitting: Family Medicine

## 2020-06-19 ENCOUNTER — Other Ambulatory Visit: Payer: Self-pay

## 2020-06-19 ENCOUNTER — Ambulatory Visit (INDEPENDENT_AMBULATORY_CARE_PROVIDER_SITE_OTHER): Payer: Federal, State, Local not specified - PPO | Admitting: Family Medicine

## 2020-06-19 ENCOUNTER — Encounter: Payer: Self-pay | Admitting: Family Medicine

## 2020-06-19 VITALS — BP 115/77 | HR 67 | Temp 98.6°F | Ht 62.0 in | Wt 114.0 lb

## 2020-06-19 DIAGNOSIS — Z114 Encounter for screening for human immunodeficiency virus [HIV]: Secondary | ICD-10-CM

## 2020-06-19 DIAGNOSIS — Z0000 Encounter for general adult medical examination without abnormal findings: Secondary | ICD-10-CM

## 2020-06-19 DIAGNOSIS — Z9189 Other specified personal risk factors, not elsewhere classified: Secondary | ICD-10-CM | POA: Diagnosis not present

## 2020-06-19 DIAGNOSIS — Z130 Encounter for screening for diseases of the blood and blood-forming organs and certain disorders involving the immune mechanism: Secondary | ICD-10-CM | POA: Diagnosis not present

## 2020-06-19 DIAGNOSIS — Z0184 Encounter for antibody response examination: Secondary | ICD-10-CM

## 2020-06-19 DIAGNOSIS — Z1159 Encounter for screening for other viral diseases: Secondary | ICD-10-CM

## 2020-06-19 DIAGNOSIS — Z131 Encounter for screening for diabetes mellitus: Secondary | ICD-10-CM

## 2020-06-19 DIAGNOSIS — Z13220 Encounter for screening for lipoid disorders: Secondary | ICD-10-CM

## 2020-06-19 DIAGNOSIS — Z111 Encounter for screening for respiratory tuberculosis: Secondary | ICD-10-CM

## 2020-06-19 NOTE — Progress Notes (Signed)
Patient ID: Martha Small, female  DOB: 1996-06-17, 24 y.o.   MRN: [id number] Patient Care Team    Relationship Specialty Notifications Start End  Ma Hillock, DO PCP - General Family Medicine  05/16/19   Rutherford Guys, MD Consulting Physician Ophthalmology  05/16/19     Chief Complaint  Patient presents with  . Annual Exam    Subjective:  Martha Small is a 24 y.o.  female present for new patient establishment. All past medical history, surgical history, allergies, family history, immunizations, medications and social history were updated in the electronic medical record today. All recent labs, ED visits and hospitalizations within the last year were reviewed.  Health maintenance:  Colonoscopy: no fhx, routine screen at 52 Mammogram: Mother breast CA at 81- early screening recommended.  Cervical cancer screening: 12/11/2019. AT GYN Immunizations: tdap UTD 05/16/2019, Influenza (encouraged yearly), has not completed covid series> counseled. Hep B series 3 completed january> she will call with dates Infectious disease screening: HIV and hep c completed today. G/C recently completed with PAP Assistive device: none Oxygen NWG:NFAO Patient has a Dental home. Hospitalizations/ED visits:reviewed  Depression screen [facility] 2/9 06/19/2020 05/16/2019  Decreased Interest 0 0  Down, Depressed, Hopeless 0 0  PHQ - 2 Score 0 0  Altered sleeping 0 -  Tired, decreased energy 0 -  Change in appetite 0 -  Feeling bad or failure about yourself  0 -  Trouble concentrating 0 -  Moving slowly or fidgety/restless 0 -  Suicidal thoughts 0 -  PHQ-9 Score 0 -  Difficult doing work/chores Not difficult at all -   No flowsheet data found.     No flowsheet data found.  Immunization History  Administered Date(s) Administered  . DTaP 12/23/1998, 02/21/1999, 04/30/1999, 01/26/2000, 12/20/2002  . Hepatitis A 12/11/2005, 01/21/2007  . Hepatitis B 04/30/1999, 07/23/1999, 10/29/1999  .  Hepatitis B, adult 07/06/2019  . HiB (PRP-OMP) 12/23/1998, 02/21/1999, 04/30/1999, 01/26/2000  . Hpv 05/20/2010, 06/30/2011, 07/07/2012  . IPV 12/23/1998, 02/21/1999, 07/23/1999, 12/20/2002  . Influenza,inj,Quad PF,6+ Mos 08/28/2015, 07/19/2016  . Influenza,inj,quad, With Preservative 10/10/2014  . MMR 10/29/1999, 12/20/2002, 07/06/2019  . Meningococcal Conjugate 05/20/2010  . Meningococcal Polysaccharide 07/17/2015  . PPD Test 06/12/2019, 06/21/2019  . Pneumococcal Conjugate-13 01/26/2000, 04/26/2000  . Td 05/20/2010, 05/16/2019  . Tdap 05/20/2010  . Varicella 10/29/1999, 01/21/2007     Hearing Screening   Method: Audiometry   _0  _1  _2  _3  _4  _5  _6  _7  _8   Right ear:           Left ear:           Comments: Pass both ears    Visual Acuity Screening   Right eye Left eye Both eyes  Without correction:     With correction: _9    Past Medical History:  Diagnosis Date  . Myopia of both eyes    No Known Allergies Past Surgical History:  Procedure Laterality Date  . WISDOM TOOTH EXTRACTION     Family History  Problem Relation Age of Onset  . Breast cancer Mother 41  . Hypertension Mother   . Skin cancer Mother   . Hyperlipidemia Father   . Hypertension Father   . Arthritis Maternal Grandmother   . Skin cancer Maternal Grandfather   . Arthritis Paternal Grandmother   . Diabetes Paternal Grandmother   . Prostate cancer Paternal Grandfather   . Diabetes Paternal Grandfather   . Hypertension Paternal Grandfather   . Hyperlipidemia Paternal Grandfather   .  Heart disease Paternal Grandfather    Social History   Social History Narrative   Marital status/children/pets: Single.    Education/employment: Electronics engineer.    Safety:      -Wears a bicycle helmet riding a bike: Yes     -smoke alarm in the home:Yes     - wears seatbelt: Yes     - Feels safe in their relationships: Yes    Allergies as of 06/19/2020   No Known  Allergies     Medication List       Accurate as of June 19, 2020  2:39 PM. If you have any questions, ask your nurse or doctor.        STOP taking these medications   Norgestimate-Ethinyl Estradiol Triphasic 0.18/0.215/0.25 MG-35 MCG tablet Commonly known as: Tri-Previfem Stopped by: Howard Pouch, DO       All past medical history, surgical history, allergies, family history, immunizations andmedications were updated in the EMR today and reviewed under the history and medication portions of their EMR.    No results found for this or any previous visit (from the past 2160 hour(s)).   ROS: 14 pt review of systems performed and negative (unless mentioned in an HPI)  Objective: BP 115/77   Pulse 67   Temp 98.6 F (37 C) (Temporal)   Ht '5\' 2"'$  (1.575 m)   Wt 114 lb (51.7 kg)   SpO2 98%   BMI 20.85 kg/m  Gen: Afebrile. No acute distress. Nontoxic in appearance, well-developed, well-nourished, pleasant female.  HENT: AT. [place]. Bilateral TM visualized and normal in appearance, normal external auditory canal. MMM, no oral lesions, adequate dentition. Bilateral nares within normal limits. Throat without erythema, ulcerations or exudates. no Cough on exam, no hoarseness on exam. Eyes:Pupils Equal Round Reactive to light, Extraocular movements intact,  Conjunctiva without redness, discharge or icterus. Neck/lymp/endocrine: Supple,no lymphadenopathy, no thyromegaly CV: RRR no murmur, no edema, +2/4 P posterior tibialis pulses.  Chest: CTAB, no wheeze, rhonchi or crackles. normal Respiratory effort. good Air movement. Abd: Soft. flat. NTND. BS presentn. no Masses palpated. No hepatosplenomegaly. No rebound tenderness or guarding. Skin: no rashes, purpura or petechiae. Warm and well-perfused. Skin intact. Neuro/Msk:  Normal gait. PERLA. EOMi. Alert. Oriented x3.  Psych: Normal affect, dress and demeanor. Normal speech. Normal thought content and judgment.  Assessment/plan: Brittnie Lewey is a 24 y.o. 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  Encounter for hepatitis C virus screening test for high risk patient - Hepatitis C antibody Need for hepatitis B screening test/ Immunity status testing - Hepatitis B surface antibody,qualitative - Hepatitis B surface antigen Screening-pulmonary TB - QuantiFERON-TB Gold Plus Encounter for screening for HIV - HIV Antibody (routine testing w rflx) Diabetes mellitus screening - Comprehensive metabolic panel - Hemoglobin A1c Lipid screening - Lipid panel Screening for deficiency anemia - CBC with Differential/Platelet Encounter for preventive health examination Patient was encouraged to exercise greater than 150 minutes a week. Patient was encouraged to choose a diet filled with fresh fruits and vegetables, and lean meats. AVS provided to patient today for education/recommendation on gender specific health and safety maintenance. Colonoscopy: no fhx, routine screen at 3 Mammogram: Mother breast CA at 44- early screening recommended.  Cervical cancer screening: 12/11/2019. AT GYN Immunizations: tdap UTD 05/16/2019, Influenza (encouraged yearly), has not completed covid series> counseled. Hep B series 3 completed january> she will call with dates Infectious disease screening: HIV and hep c completed today. G/C recently completed with  PAP  Return in about 1 year (around 06/19/2021) for CPE (30 min).  Orders Placed This Encounter  Procedures  . CBC with Differential/Platelet  . Comprehensive metabolic panel  . Lipid panel  . Hemoglobin A1c  . QuantiFERON-TB Gold Plus  . Hepatitis B surface antibody,qualitative  . Hepatitis B surface antigen  . Hepatitis C antibody  . HIV Antibody (routine testing w rflx)   No orders of the defined types were placed in this encounter.  Referral Orders  No referral(s) requested today     Note is dictated utilizing voice recognition software. Although note has  been proof read prior to signing, occasional typographical errors still can be missed. If any questions arise, please do not hesitate to call for verification.  Electronically signed by: Howard Pouch, DO [place]

## 2020-06-19 NOTE — Patient Instructions (Signed)
Health Maintenance, Female Adopting a healthy lifestyle and getting preventive care are important in promoting health and wellness. Ask your health care provider about:  The right schedule for you to have regular tests and exams.  Things you can do on your own to prevent diseases and keep yourself healthy. What should I know about diet, weight, and exercise? Eat a healthy diet   Eat a diet that includes plenty of vegetables, fruits, low-fat dairy products, and lean protein.  Do not eat a lot of foods that are high in solid fats, added sugars, or sodium. Maintain a healthy weight Body mass index (BMI) is used to identify weight problems. It estimates body fat based on height and weight. Your health care provider can help determine your BMI and help you achieve or maintain a healthy weight. Get regular exercise Get regular exercise. This is one of the most important things you can do for your health. Most adults should:  Exercise for at least 150 minutes each week. The exercise should increase your heart rate and make you sweat (moderate-intensity exercise).  Do strengthening exercises at least twice a week. This is in addition to the moderate-intensity exercise.  Spend less time sitting. Even light physical activity can be beneficial. Watch cholesterol and blood lipids Have your blood tested for lipids and cholesterol at 22 years of age, then have this test every 5 years. Have your cholesterol levels checked more often if:  Your lipid or cholesterol levels are high.  You are older than 22 years of age.  You are at high risk for heart disease. What should I know about cancer screening? Depending on your health history and family history, you may need to have cancer screening at various ages. This may include screening for:  Breast cancer.  Cervical cancer.  Colorectal cancer.  Skin cancer.  Lung cancer. What should I know about heart disease, diabetes, and high blood  pressure? Blood pressure and heart disease  High blood pressure causes heart disease and increases the risk of stroke. This is more likely to develop in people who have high blood pressure readings, are of African descent, or are overweight.  Have your blood pressure checked: ? Every 3-5 years if you are 18-39 years of age. ? Every year if you are 40 years old or older. Diabetes Have regular diabetes screenings. This checks your fasting blood sugar level. Have the screening done:  Once every three years after age 40 if you are at a normal weight and have a low risk for diabetes.  More often and at a younger age if you are overweight or have a high risk for diabetes. What should I know about preventing infection? Hepatitis B If you have a higher risk for hepatitis B, you should be screened for this virus. Talk with your health care provider to find out if you are at risk for hepatitis B infection. Hepatitis C Testing is recommended for:  Everyone born from 1945 through 1965.  Anyone with known risk factors for hepatitis C. Sexually transmitted infections (STIs)  Get screened for STIs, including gonorrhea and chlamydia, if: ? You are sexually active and are younger than 22 years of age. ? You are older than 22 years of age and your health care provider tells you that you are at risk for this type of infection. ? Your sexual activity has changed since you were last screened, and you are at increased risk for chlamydia or gonorrhea. Ask your health care provider if   you are at risk.  Ask your health care provider about whether you are at high risk for HIV. Your health care provider may recommend a prescription medicine to help prevent HIV infection. If you choose to take medicine to prevent HIV, you should first get tested for HIV. You should then be tested every 3 months for as long as you are taking the medicine. Pregnancy  If you are about to stop having your period (premenopausal) and  you may become pregnant, seek counseling before you get pregnant.  Take 400 to 800 micrograms (mcg) of folic acid every day if you become pregnant.  Ask for birth control (contraception) if you want to prevent pregnancy. Osteoporosis and menopause Osteoporosis is a disease in which the bones lose minerals and strength with aging. This can result in bone fractures. If you are 65 years old or older, or if you are at risk for osteoporosis and fractures, ask your health care provider if you should:  Be screened for bone loss.  Take a calcium or vitamin D supplement to lower your risk of fractures.  Be given hormone replacement therapy (HRT) to treat symptoms of menopause. Follow these instructions at home: Lifestyle  Do not use any products that contain nicotine or tobacco, such as cigarettes, e-cigarettes, and chewing tobacco. If you need help quitting, ask your health care provider.  Do not use street drugs.  Do not share needles.  Ask your health care provider for help if you need support or information about quitting drugs. Alcohol use  Do not drink alcohol if: ? Your health care provider tells you not to drink. ? You are pregnant, may be pregnant, or are planning to become pregnant.  If you drink alcohol: ? Limit how much you use to 0-1 drink a day. ? Limit intake if you are breastfeeding.  Be aware of how much alcohol is in your drink. In the [place] one drink equals one 12 oz bottle of beer (355 mL), one 5 oz glass of wine (148 mL), or one 1 oz glass of hard liquor (44 mL). General instructions  Schedule regular health, dental, and eye exams.  Stay current with your vaccines.  Tell your health care provider if: ? You often feel depressed. ? You have ever been abused or do not feel safe at home. Summary  Adopting a healthy lifestyle and getting preventive care are important in promoting health and wellness.  Follow your health care provider's instructions about healthy  diet, exercising, and getting tested or screened for diseases.  Follow your health care provider's instructions on monitoring your cholesterol and blood pressure. This information is not intended to replace advice given to you by your health care provider. Make sure you discuss any questions you have with your health care provider. Document Revised: 11/09/2018 Document Reviewed: 11/09/2018 Elsevier Patient Education  2020 Elsevier Inc.  

## 2020-06-20 LAB — COMPREHENSIVE METABOLIC PANEL
AG Ratio: 2 (calc) (ref 1.0–2.5)
ALT: 19 U/L (ref 6–29)
AST: 19 U/L (ref 10–30)
Albumin: 4.7 g/dL (ref 3.6–5.1)
Alkaline phosphatase (APISO): 51 U/L (ref 31–125)
BUN: 10 mg/dL (ref 7–25)
CO2: 22 mmol/L (ref 20–32)
Calcium: 9.8 mg/dL (ref 8.6–10.2)
Chloride: 105 mmol/L (ref 98–110)
Creat: 0.7 mg/dL (ref 0.50–1.10)
Globulin: 2.4 g/dL (calc) (ref 1.9–3.7)
Glucose, Bld: 76 mg/dL (ref 65–99)
Potassium: 4.1 mmol/L (ref 3.5–5.3)
Sodium: 137 mmol/L (ref 135–146)
Total Bilirubin: 0.6 mg/dL (ref 0.2–1.2)
Total Protein: 7.1 g/dL (ref 6.1–8.1)

## 2020-06-20 LAB — CBC WITH DIFFERENTIAL/PLATELET
Absolute Monocytes: 694 cells/uL (ref 200–950)
Basophils Absolute: 38 cells/uL (ref 0–200)
Basophils Relative: 0.4 %
Eosinophils Absolute: 152 cells/uL (ref 15–500)
Eosinophils Relative: 1.6 %
HCT: 41.6 % (ref 35.0–45.0)
Hemoglobin: 14.1 g/dL (ref 11.7–15.5)
Lymphs Abs: 4351 cells/uL — ABNORMAL HIGH (ref 850–3900)
MCH: 31.3 pg (ref 27.0–33.0)
MCHC: 33.9 g/dL (ref 32.0–36.0)
MCV: 92.2 fL (ref 80.0–100.0)
MPV: 9.5 fL (ref 7.5–12.5)
Monocytes Relative: 7.3 %
Neutro Abs: 4266 cells/uL (ref 1500–7800)
Neutrophils Relative %: 44.9 %
Platelets: 376 10*3/uL (ref 140–400)
RBC: 4.51 10*6/uL (ref 3.80–5.10)
RDW: 12 % (ref 11.0–15.0)
Total Lymphocyte: 45.8 %
WBC: 9.5 10*3/uL (ref 3.8–10.8)

## 2020-06-20 LAB — HEMOGLOBIN A1C
Hgb A1c MFr Bld: 4.8 % of total Hgb (ref <5.7)
Mean Plasma Glucose: 91 (calc)
eAG (mmol/L): 5 (calc)

## 2020-06-20 LAB — LIPID PANEL
Cholesterol: 149 mg/dL (ref <200)
HDL: 66 mg/dL (ref >=50)
LDL Cholesterol (Calc): 65 mg/dL (calc)
Non-HDL Cholesterol (Calc): 83 mg/dL (calc) (ref <130)
Total CHOL/HDL Ratio: 2.3 (calc) (ref <5.0)
Triglycerides: 98 mg/dL (ref <150)

## 2020-06-20 LAB — HIV ANTIBODY (ROUTINE TESTING W REFLEX): HIV 1&2 Ab, 4th Generation: NONREACTIVE

## 2020-06-20 LAB — HEPATITIS C ANTIBODY
Hepatitis C Ab: NONREACTIVE
SIGNAL TO CUT-OFF: 0.01 (ref <1.00)

## 2020-06-20 LAB — HEPATITIS B SURFACE ANTIGEN: Hepatitis B Surface Ag: NONREACTIVE

## 2020-06-20 LAB — HEPATITIS B SURFACE ANTIBODY,QUALITATIVE: Hep B S Ab: REACTIVE — AB

## 2020-06-22 LAB — QUANTIFERON-TB GOLD PLUS
Mitogen-NIL: >10 IU/mL
NIL: 0.18 IU/mL
QuantiFERON-TB Gold Plus: NEGATIVE
TB1-NIL: <0 IU/mL
TB2-NIL: <0 IU/mL

## 2020-07-10 DIAGNOSIS — Z1152 Encounter for screening for COVID-19: Secondary | ICD-10-CM | POA: Diagnosis not present

## 2020-07-10 DIAGNOSIS — Z20822 Contact with and (suspected) exposure to covid-19: Secondary | ICD-10-CM | POA: Diagnosis not present

## 2020-08-07 IMAGING — US US BREAST*R* LIMITED INC AXILLA
1 series · 4 of 4 positions shown · non-contrast
Comparison: None.

CLINICAL DATA: 23-year-old female complaining of a palpable
abnormality in the 10 o'clock region of the right breast.

EXAM:
ULTRASOUND OF THE RIGHT BREAST

[Series 1: us breast*right* limited inc axilla · 0.06mm/px · 4 of 4 slices shown]
[im 1/4]
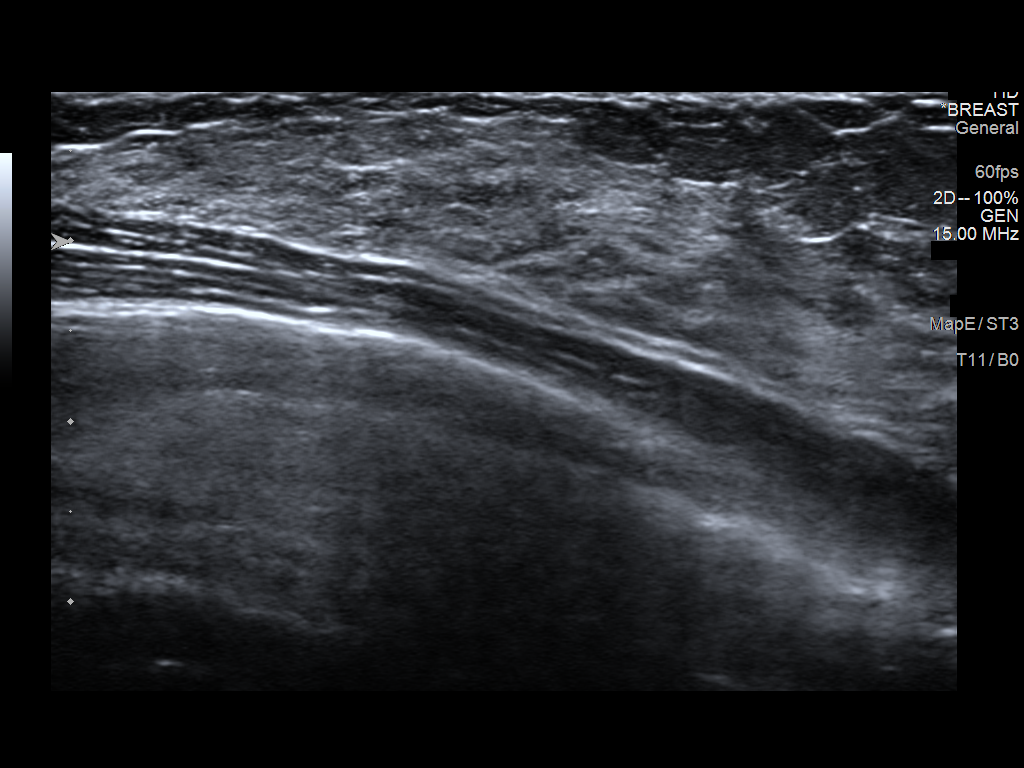
[im 2/4]
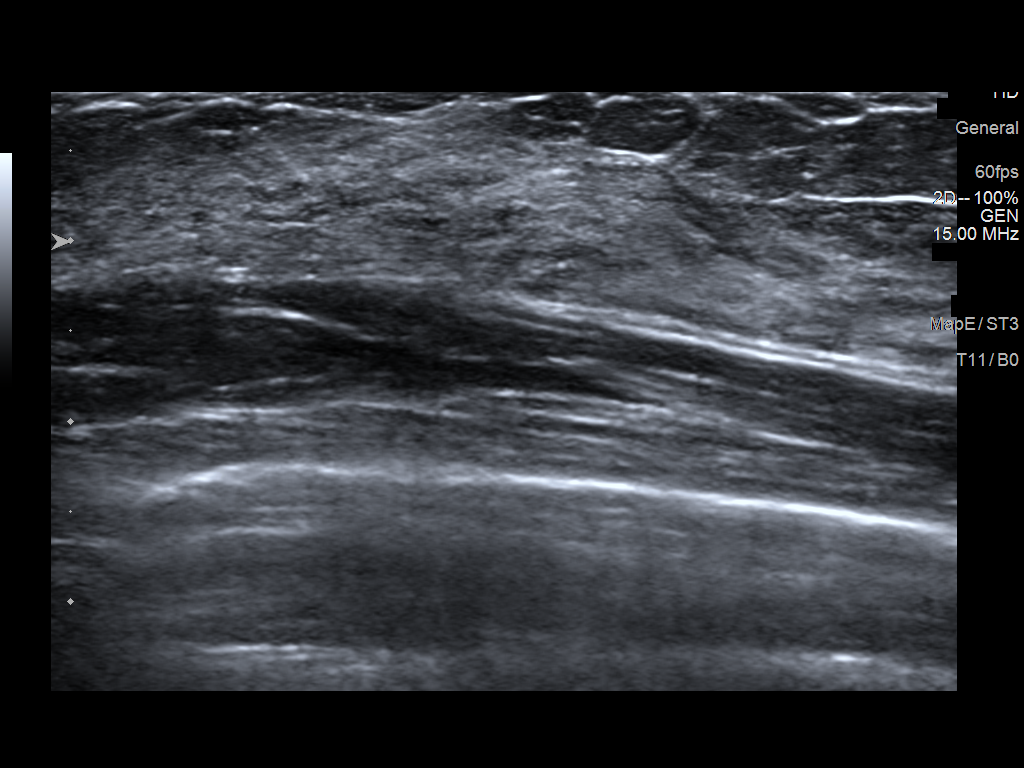
[im 3/4]
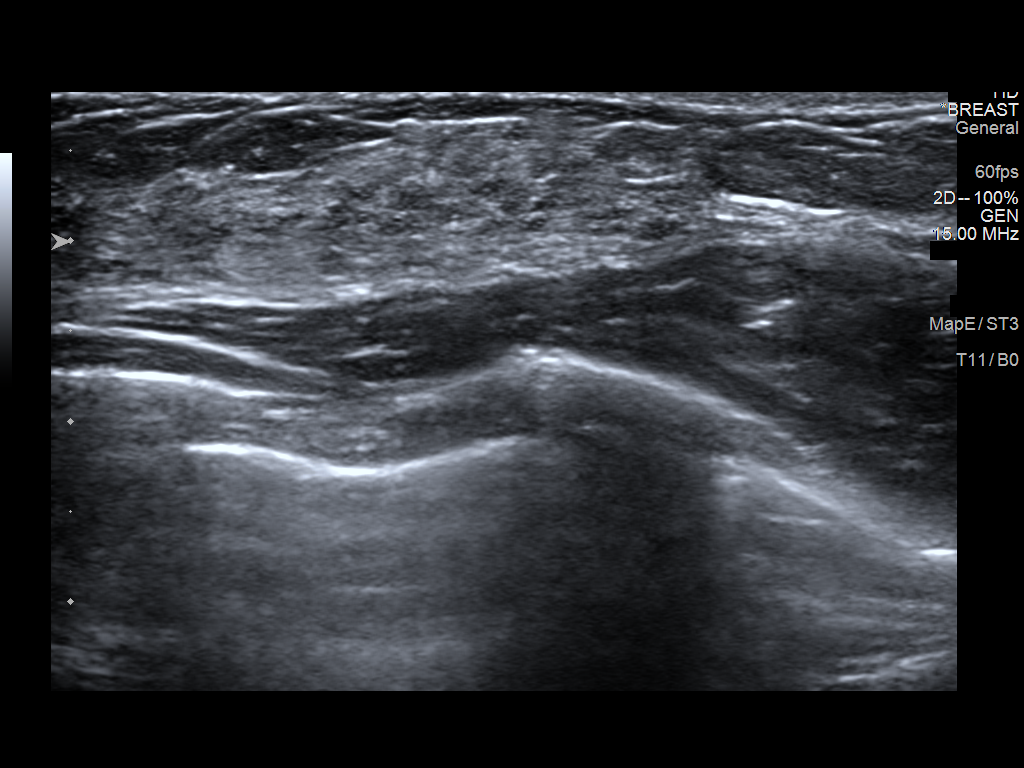
[im 4/4]
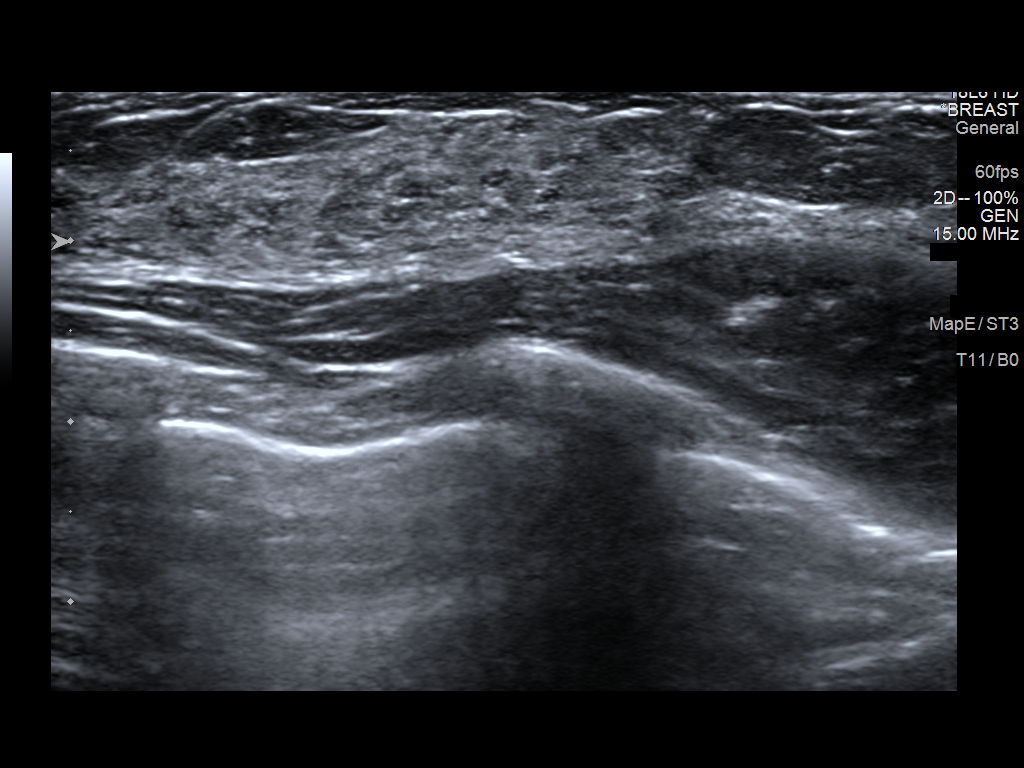

[4 of 4 positions shown; findings below may reference images not displayed]

FINDINGS: On physical exam, I palpate thickening in the right breast at 10
o'clock 5 cm from the nipple. I do not palpate a discrete mass.

Targeted ultrasound is performed, showing normal fibroglandular
tissue in the right breast at 10 o'clock 5 cm from the nipple. No
solid or cystic mass, abnormal shadowing or distortion visualized.
IMPRESSION: No sonographic evidence of the area of clinical concern in the 10
o'clock region of the right breast.

RECOMMENDATION:
If the clinical exam remains benign/stable screening mammography can
be deferred until the age of 40.

I have discussed the findings and recommendations with the patient.
If applicable, a reminder letter will be sent to the patient
regarding the next appointment.

BI-RADS CATEGORY  1: Negative.

## 2020-08-19 DIAGNOSIS — J09X9 Influenza due to identified novel influenza A virus with other manifestations: Secondary | ICD-10-CM | POA: Diagnosis not present

## 2020-08-19 DIAGNOSIS — Z20822 Contact with and (suspected) exposure to covid-19: Secondary | ICD-10-CM | POA: Diagnosis not present

## 2020-08-21 DIAGNOSIS — Z20828 Contact with and (suspected) exposure to other viral communicable diseases: Secondary | ICD-10-CM | POA: Diagnosis not present

## 2020-08-26 DIAGNOSIS — Z23 Encounter for immunization: Secondary | ICD-10-CM | POA: Diagnosis not present

## 2020-09-05 DIAGNOSIS — Z23 Encounter for immunization: Secondary | ICD-10-CM | POA: Diagnosis not present

## 2020-12-16 ENCOUNTER — Encounter: Payer: Federal, State, Local not specified - PPO | Admitting: Nurse Practitioner

## 2020-12-23 ENCOUNTER — Encounter: Payer: Self-pay | Admitting: Nurse Practitioner

## 2020-12-23 ENCOUNTER — Other Ambulatory Visit: Payer: Self-pay

## 2020-12-23 ENCOUNTER — Ambulatory Visit (INDEPENDENT_AMBULATORY_CARE_PROVIDER_SITE_OTHER): Payer: Federal, State, Local not specified - PPO | Admitting: Nurse Practitioner

## 2020-12-23 VITALS — BP 122/80 | Ht 62.0 in | Wt 114.0 lb

## 2020-12-23 DIAGNOSIS — Z01419 Encounter for gynecological examination (general) (routine) without abnormal findings: Secondary | ICD-10-CM

## 2020-12-23 DIAGNOSIS — Z3009 Encounter for other general counseling and advice on contraception: Secondary | ICD-10-CM | POA: Diagnosis not present

## 2020-12-23 NOTE — Progress Notes (Signed)
   Georjean Toya [birth year] [id number]   History:  25 y.o. G0 presents for annual exam without GYN complaints. Regular monthly cycle. Normal pap history. Gardasil series completed. Sexually active with same partner. Would like to discuss contraception. She was on tri-phasic OCPs but felt it affected her moods so she would like a progestin-only method. She stopped this about 6 months ago.   Gynecologic History Patient's last menstrual period was 12/16/2020. Period Cycle (Days): 28 Period Duration (Days): 5 Period Pattern: Regular Menstrual Flow: Moderate Menstrual Control: Maxi pad Dysmenorrhea: (!) Mild Dysmenorrhea Symptoms: Cramping Contraception/Family planning: none  Health Maintenance Last Pap: 12/11/2019. Results were: normal   Past medical history, past surgical history, family history and social history were all reviewed and documented in the EPIC chart. 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   ROS:  A ROS was performed and pertinent positives and negatives are included.  Exam:  Vitals:   12/23/20 1331  BP: 122/80  Weight: 114 lb (51.7 kg)  Height: 5\' 2"  (1.575 m)   Body mass index is 20.85 kg/m.  General appearance:  Normal Thyroid:  Symmetrical, normal in size, without palpable masses or nodularity. Respiratory  Auscultation:  Clear without wheezing or rhonchi Cardiovascular  Auscultation:  Regular rate, without rubs, murmurs or gallops  Edema/varicosities:  Not grossly evident Abdominal  Soft,nontender, without masses, guarding or rebound.  Liver/spleen:  No organomegaly noted  Hernia:  None appreciated  Skin  Inspection:  Grossly normal   Breasts: Examined lying and sitting.   Right: Without masses, retractions, discharge or axillary adenopathy.   Left: Without masses, retractions, discharge or axillary adenopathy. Gentitourinary   Inguinal/mons:  Normal without inguinal adenopathy  External genitalia:  Normal  BUS/Urethra/Skene's  glands:  Normal  Vagina:  Normal  Cervix:  Normal  Uterus:  Normal in size, shape and contour.  Midline and mobile  Adnexa/parametria:     Rt: Without masses or tenderness.   Lt: Without masses or tenderness.  Anus and perineum: Normal  Assessment/Plan:  25 y.o. G0 for annual exam.   Well female exam with routine gynecological exam - Education provided on SBEs, importance of preventative screenings, current guidelines, high calcium diet, regular exercise, and multivitamin daily.   Encounter for other general counseling and advice on contraception - Plan: Skyla Insertion. Contraceptive options were reviewed, including hormonal methods, both combination (pill, patch, vaginal ring) and progesterone-only (pill, Depo Provera and Nexplanon), intrauterine devices (Mirena, [place], [place], and [place]), barrier methods (condoms, diaphragm) and female/female sterilization. The mechanisms, risks, benefits and side effects of all methods were discussed. She has tried OCPs but felt it affected her mood and would like a progestin-only method. She is interested in [place] - we will schedule insertion with Dr. [place]. Brochure provided on IUD and all questions answered.   Screening for cervical cancer - Normal pap history. Will repeat at 3-year interval per guidelines.  Follow up in 1 year for annual.     Seymour Bars [facility], 1:47 PM 12/23/2020

## 2020-12-23 NOTE — Patient Instructions (Signed)
Health Maintenance, Female Adopting a healthy lifestyle and getting preventive care are important in promoting health and wellness. Ask your health care provider about:  The right schedule for you to have regular tests and exams.  Things you can do on your own to prevent diseases and keep yourself healthy. What should I know about diet, weight, and exercise? Eat a healthy diet  Eat a diet that includes plenty of vegetables, fruits, low-fat dairy products, and lean protein.  Do not eat a lot of foods that are high in solid fats, added sugars, or sodium.   Maintain a healthy weight Body mass index (BMI) is used to identify weight problems. It estimates body fat based on height and weight. Your health care provider can help determine your BMI and help you achieve or maintain a healthy weight. Get regular exercise Get regular exercise. This is one of the most important things you can do for your health. Most adults should:  Exercise for at least 150 minutes each week. The exercise should increase your heart rate and make you sweat (moderate-intensity exercise).  Do strengthening exercises at least twice a week. This is in addition to the moderate-intensity exercise.  Spend less time sitting. Even light physical activity can be beneficial. Watch cholesterol and blood lipids Have your blood tested for lipids and cholesterol at 23 years of age, then have this test every 5 years. Have your cholesterol levels checked more often if:  Your lipid or cholesterol levels are high.  You are older than 23 years of age.  You are at high risk for heart disease. What should I know about cancer screening? Depending on your health history and family history, you may need to have cancer screening at various ages. This may include screening for:  Breast cancer.  Cervical cancer.  Colorectal cancer.  Skin cancer.  Lung cancer. What should I know about heart disease, diabetes, and high blood  pressure? Blood pressure and heart disease  High blood pressure causes heart disease and increases the risk of stroke. This is more likely to develop in people who have high blood pressure readings, are of African descent, or are overweight.  Have your blood pressure checked: ? Every 3-5 years if you are 18-39 years of age. ? Every year if you are 40 years old or older. Diabetes Have regular diabetes screenings. This checks your fasting blood sugar level. Have the screening done:  Once every three years after age 40 if you are at a normal weight and have a low risk for diabetes.  More often and at a younger age if you are overweight or have a high risk for diabetes. What should I know about preventing infection? Hepatitis B If you have a higher risk for hepatitis B, you should be screened for this virus. Talk with your health care provider to find out if you are at risk for hepatitis B infection. Hepatitis C Testing is recommended for:  Everyone born from 1945 through 1965.  Anyone with known risk factors for hepatitis C. Sexually transmitted infections (STIs)  Get screened for STIs, including gonorrhea and chlamydia, if: ? You are sexually active and are younger than 24 years of age. ? You are older than 24 years of age and your health care provider tells you that you are at risk for this type of infection. ? Your sexual activity has changed since you were last screened, and you are at increased risk for chlamydia or gonorrhea. Ask your health care provider   if you are at risk.  Ask your health care provider about whether you are at high risk for HIV. Your health care provider may recommend a prescription medicine to help prevent HIV infection. If you choose to take medicine to prevent HIV, you should first get tested for HIV. You should then be tested every 3 months for as long as you are taking the medicine. Pregnancy  If you are about to stop having your period (premenopausal) and  you may become pregnant, seek counseling before you get pregnant.  Take 400 to 800 micrograms (mcg) of folic acid every day if you become pregnant.  Ask for birth control (contraception) if you want to prevent pregnancy. Osteoporosis and menopause Osteoporosis is a disease in which the bones lose minerals and strength with aging. This can result in bone fractures. If you are 65 years old or older, or if you are at risk for osteoporosis and fractures, ask your health care provider if you should:  Be screened for bone loss.  Take a calcium or vitamin D supplement to lower your risk of fractures.  Be given hormone replacement therapy (HRT) to treat symptoms of menopause. Follow these instructions at home: Lifestyle  Do not use any products that contain nicotine or tobacco, such as cigarettes, e-cigarettes, and chewing tobacco. If you need help quitting, ask your health care provider.  Do not use street drugs.  Do not share needles.  Ask your health care provider for help if you need support or information about quitting drugs. Alcohol use  Do not drink alcohol if: ? Your health care provider tells you not to drink. ? You are pregnant, may be pregnant, or are planning to become pregnant.  If you drink alcohol: ? Limit how much you use to 0-1 drink a day. ? Limit intake if you are breastfeeding.  Be aware of how much alcohol is in your drink. In the [place] one drink equals one 12 oz bottle of beer (355 mL), one 5 oz glass of wine (148 mL), or one 1 oz glass of hard liquor (44 mL). General instructions  Schedule regular health, dental, and eye exams.  Stay current with your vaccines.  Tell your health care provider if: ? You often feel depressed. ? You have ever been abused or do not feel safe at home. Summary  Adopting a healthy lifestyle and getting preventive care are important in promoting health and wellness.  Follow your health care provider's instructions about healthy  diet, exercising, and getting tested or screened for diseases.  Follow your health care provider's instructions on monitoring your cholesterol and blood pressure. This information is not intended to replace advice given to you by your health care provider. Make sure you discuss any questions you have with your health care provider. Document Revised: 11/09/2018 Document Reviewed: 11/09/2018 Elsevier Patient Education  2021 Elsevier Inc.  

## 2021-01-17 ENCOUNTER — Other Ambulatory Visit: Payer: Self-pay

## 2021-01-17 ENCOUNTER — Ambulatory Visit (INDEPENDENT_AMBULATORY_CARE_PROVIDER_SITE_OTHER): Payer: Federal, State, Local not specified - PPO | Admitting: Obstetrics & Gynecology

## 2021-01-17 ENCOUNTER — Encounter: Payer: Self-pay | Admitting: Obstetrics & Gynecology

## 2021-01-17 ENCOUNTER — Ambulatory Visit: Payer: Federal, State, Local not specified - PPO | Admitting: Obstetrics & Gynecology

## 2021-01-17 VITALS — BP 118/70 | HR 64 | Resp 18

## 2021-01-17 DIAGNOSIS — Z30430 Encounter for insertion of intrauterine contraceptive device: Secondary | ICD-10-CM

## 2021-01-25 ENCOUNTER — Encounter: Payer: Self-pay | Admitting: Obstetrics & Gynecology

## 2021-01-25 NOTE — Progress Notes (Signed)
    Martha Small 22-Feb-1996 [id number]        25 y.o.  G0  RP: Skyla IUD insertion  HPI: LMP 01/14/21 currently menstruating normally.  Using condoms.  Stopped the BCPs 6 months ago because it affected her moods.  Decision to use Skyla IUD.   OB History  Gravida Para Term Preterm AB Living  0 0 0 0 0 0  SAB IAB Ectopic Multiple Live Births  0 0 0 0 0    Past medical history,surgical history, problem list, medications, allergies, family history and social history were all reviewed and documented in the EPIC chart.   Directed ROS with pertinent positives and negatives documented in the history of present illness/assessment and plan.  Exam:  Vitals:   01/17/21 1218  BP: 118/70  Pulse: 64  Resp: 18   General appearance:  Normal                                                                    IUD procedure note       Patient presented to the office today for placement of Skyla IUD. The patient had previously been provided with literature information on this method of contraception. The risks benefits and pros and cons were discussed and all her questions were answered. She is fully aware that this form of contraception is 99% effective and is good for 3 years.  Pelvic exam: Vulva normal Vagina: No lesions or discharge Cervix: No lesions or discharge Uterus: AV position Adnexa: No masses or tenderness Rectal exam: Not done  The cervix was cleansed with Betadine solution. Hurricane spray on the cervix.  A single-tooth tenaculum was placed on the anterior cervical lip.  Os finder used, hysterometry 6.5 cm.  The IUD was shown to the patient and inserted in a sterile fashion.  The IUD string was trimmed. The single-tooth tenaculum was removed. Patient was instructed to return back to the office in one month for follow up.        Assessment/Plan:  25 y.o. G0P0000   1. Encounter for IUD insertion Skyla IUD contraception counseling done.  IUD insertion procedure explained  to patient.  Skyla IUD inserted without difficulty.  No complication.  Well-tolerated by patient.  Post procedure precautions reviewed.  We will follow-up in 4 weeks for IUD check.  Genia Del MD, 8:20 PM 01/25/2021

## 2021-02-12 ENCOUNTER — Telehealth: Payer: Self-pay

## 2021-02-12 NOTE — Telephone Encounter (Signed)
Patient has a visit scheduled on Friday for IUD recheck. She said she is at [facility] and would like to ask if you thought she could have this follow up check with [facility] at school versus driving home for the visit?

## 2021-02-13 NOTE — Telephone Encounter (Signed)
Yes, speculum exam to look at cervix and confirm no sign of infection and IUD strings visible at exocervix.

## 2021-02-13 NOTE — Telephone Encounter (Signed)
I called patient and per DPR access note on file I left detailed message and told her she could have the exam done at [facility] but will need a speculum exam to look at the cervix and confirm no sign of infection and that the IUD strings are visible at exocervix. I asked her to call us back and cancel her Friday appt if she decides to have exam at school.

## 2021-02-14 ENCOUNTER — Ambulatory Visit: Payer: Federal, State, Local not specified - PPO | Admitting: Obstetrics & Gynecology

## 2021-06-30 HISTORY — PX: LASIK: SHX215

## 2022-01-09 ENCOUNTER — Other Ambulatory Visit: Payer: Self-pay

## 2022-01-12 ENCOUNTER — Ambulatory Visit: Payer: Federal, State, Local not specified - PPO | Admitting: Family Medicine

## 2022-01-12 ENCOUNTER — Other Ambulatory Visit: Payer: Self-pay

## 2022-01-12 ENCOUNTER — Encounter: Payer: Self-pay | Admitting: Family Medicine

## 2022-01-12 VITALS — BP 126/78 | HR 98 | Temp 98.0°F | Ht 62.0 in | Wt 115.0 lb

## 2022-01-12 DIAGNOSIS — D72820 Lymphocytosis (symptomatic): Secondary | ICD-10-CM

## 2022-01-12 DIAGNOSIS — R221 Localized swelling, mass and lump, neck: Secondary | ICD-10-CM

## 2022-01-12 LAB — CBC WITH DIFFERENTIAL/PLATELET
Basophils Absolute: 0 10*3/uL (ref 0.0–0.1)
Basophils Relative: 0.4 % (ref 0.0–3.0)
Eosinophils Absolute: 0.1 10*3/uL (ref 0.0–0.7)
Eosinophils Relative: 1.5 % (ref 0.0–5.0)
HCT: 39.9 % (ref 36.0–46.0)
Hemoglobin: 13.3 g/dL (ref 12.0–15.0)
Lymphocytes Relative: 44 % (ref 12.0–46.0)
Lymphs Abs: 2.8 10*3/uL (ref 0.7–4.0)
MCHC: 33.3 g/dL (ref 30.0–36.0)
MCV: 93 fl (ref 78.0–100.0)
Monocytes Absolute: 0.4 10*3/uL (ref 0.1–1.0)
Monocytes Relative: 5.6 % (ref 3.0–12.0)
Neutro Abs: 3.1 10*3/uL (ref 1.4–7.7)
Neutrophils Relative %: 48.5 % (ref 43.0–77.0)
Platelets: 308 10*3/uL (ref 150.0–400.0)
RBC: 4.29 Mil/uL (ref 3.87–5.11)
RDW: 13 % (ref 11.5–15.5)
WBC: 6.4 10*3/uL (ref 4.0–10.5)

## 2022-01-12 NOTE — Patient Instructions (Signed)
Great to see you today.  I have refilled the medication(s) we provide.   If labs were collected, we will inform you of lab results once received either by echart message or telephone call.   - echart message- for normal results that have been seen by the patient already.   - telephone call: abnormal results or if patient has not viewed results in their echart.  

## 2022-01-12 NOTE — Progress Notes (Signed)
This visit occurred during the SARS-CoV-2 public health emergency.  Safety protocols were in place, including screening questions prior to the visit, additional usage of staff PPE, and extensive cleaning of exam room while observing appropriate contact time as indicated for disinfecting solutions.    Martha Small , 1996-03-10, 26 y.o., female MRN: [id number] Patient Care Team    Relationship Specialty Notifications Start End  Natalia Leatherwood, DO PCP - General Family Medicine  05/16/19   Martha Bolus, MD Consulting Physician Ophthalmology  05/16/19     Chief Complaint  Patient presents with   Mass    Pt c/o cyst on the R submandibular x 2.5 wks; declines being sick or having any scratches that she is aware of     Subjective: Pt presents for an OV with complaints of right jaw mass of 2.5 weeks. She noticed one day in the mirror and realized she did not have one on the other side. She reports it is uncomfortable if she presses on it, otherwise it is not painful.  She has never had anything like this in the past.  She has a h/o mild lymphocytosis 2021, she forgot to return for follow up labs.  She denies night sweats, unintentional weight loss, fever, chills or rash.  She denies any recent injury to area or illness.   Depression screen [facility] 2/9 01/12/2022 06/19/2020 05/16/2019  Decreased Interest 0 0 0  Down, Depressed, Hopeless 0 0 0  PHQ - 2 Score 0 0 0  Altered sleeping - 0 -  Tired, decreased energy - 0 -  Change in appetite - 0 -  Feeling bad or failure about yourself  - 0 -  Trouble concentrating - 0 -  Moving slowly or fidgety/restless - 0 -  Suicidal thoughts - 0 -  PHQ-9 Score - 0 -  Difficult doing work/chores - Not difficult at all -    No Known Allergies Social History   Social History Narrative   Marital status/children/pets: Single.    Education/employment: Archivist.    Safety:      -Wears a bicycle helmet riding a bike: Yes     -smoke alarm in the  home:Yes     - wears seatbelt: Yes     - Feels safe in their relationships: Yes   Past Medical History:  Diagnosis Date   Myopia of both eyes    Past Surgical History:  Procedure Laterality Date   WISDOM TOOTH EXTRACTION     Family History  Problem Relation Age of Onset   Breast cancer Mother 59   Hypertension Mother    Skin cancer Mother    Hyperlipidemia Father    Hypertension Father    Arthritis Maternal Grandmother    Skin cancer Maternal Grandfather    Arthritis Paternal Grandmother    Diabetes Paternal Grandmother    Prostate cancer Paternal Grandfather    Diabetes Paternal Grandfather    Hypertension Paternal Grandfather    Hyperlipidemia Paternal Grandfather    Heart disease Paternal Grandfather    Allergies as of 01/12/2022   No Known Allergies      Medication List        Accurate as of January 12, 2022 11:05 AM. If you have any questions, ask your nurse or doctor.          Martha Small 13.5 MG Iud Generic drug: Levonorgestrel by Intrauterine route. What changed: Another medication with the same name was removed. Continue taking this medication, and follow  the directions you see here. Changed by: Martha Pacini, DO        All past medical history, surgical history, allergies, family history, immunizations andmedications were updated in the EMR today and reviewed under the history and medication portions of their EMR.     ROS Negative, with the exception of above mentioned in HPI   Objective:  BP 126/78    Pulse 98    Temp 98 F (36.7 C) (Oral)    Ht 5\' 2"  (1.575 m)    Wt 115 lb (52.2 kg)    SpO2 100%    BMI 21.03 kg/m  Body mass index is 21.03 kg/m. Physical Exam Neck:    Vitals and nursing note reviewed.  Constitutional:      General: She is not in acute distress.    Appearance: Normal appearance. She is normal weight. She is not ill-appearing or toxic-appearing.  HENT:     Head: Atraumatic.     Right Ear: Tympanic membrane, ear canal and  external ear normal.     Left Ear: Tympanic membrane, ear canal and external ear normal.  Eyes:     Extraocular Movements: Extraocular movements intact.     Conjunctiva/sclera: Conjunctivae normal.     Pupils: Pupils are equal, round, and reactive to light.  Neurological:     Mental Status: She is alert and oriented to person, place, and time. Mental status is at baseline.  Psychiatric:        Mood and Affect: Mood normal.        Behavior: Behavior normal.        Thought Content: Thought content normal.        Judgment: Judgment normal.     No results found. No results found. No results found for this or any previous visit (from the past 24 hour(s)).  Assessment/Plan: Martha Small is a 26 y.o. female present for OV for  Neck mass/leukocytosis Uncertain etiology of mass.  It is hard, mildly tender and nonmobile well.  It is in a peculiar location just inferior and anterior to mandibular angle.  Discussed work-up with CBC and would need to obtain imaging studies to rule out enlarged lymph node versus worrisome mass - CBC with Differential/Platelet - 30 SOFT TISSUE HEAD & NECK (NON-THYROID); Future Follow-up dependent upon imaging results.  Reviewed expectations re: course of current medical issues. Discussed self-management of symptoms. Outlined signs and symptoms indicating need for more acute intervention. Patient verbalized understanding and all questions were answered. Patient received an After-Visit Summary.    Orders Placed This Encounter  Procedures   US SOFT TISSUE HEAD & NECK (NON-THYROID)   CBC with Differential/Platelet    No orders of the defined types were placed in this encounter.  Referral Orders  No referral(s) requested today     Note is dictated utilizing voice recognition software. Although note has been proof read prior to signing, occasional typographical errors still can be missed. If any questions arise, please do not hesitate to call for  verification.   electronically signed by:  [place], DO  [facility] - [place]

## 2022-01-14 ENCOUNTER — Ambulatory Visit (HOSPITAL_BASED_OUTPATIENT_CLINIC_OR_DEPARTMENT_OTHER): Payer: BC Managed Care – PPO

## 2022-01-14 ENCOUNTER — Ambulatory Visit (HOSPITAL_BASED_OUTPATIENT_CLINIC_OR_DEPARTMENT_OTHER)
Admission: RE | Admit: 2022-01-14 | Discharge: 2022-01-14 | Disposition: A | Discharge status: Home / Self Care | Payer: BC Managed Care – PPO | Source: Ambulatory Visit | Attending: Family Medicine | Admitting: Family Medicine

## 2022-01-14 ENCOUNTER — Telehealth: Payer: Self-pay | Admitting: Family Medicine

## 2022-01-14 ENCOUNTER — Other Ambulatory Visit: Payer: Self-pay

## 2022-01-14 DIAGNOSIS — R221 Localized swelling, mass and lump, neck: Secondary | ICD-10-CM | POA: Diagnosis not present

## 2022-01-14 DIAGNOSIS — D72820 Lymphocytosis (symptomatic): Secondary | ICD-10-CM | POA: Insufficient documentation

## 2022-01-14 DIAGNOSIS — R220 Localized swelling, mass and lump, head: Secondary | ICD-10-CM | POA: Diagnosis not present

## 2022-01-14 NOTE — Telephone Encounter (Signed)
Spoke with pt regarding labs and instructions.   

## 2022-01-14 NOTE — Telephone Encounter (Signed)
° ° °  Two small  lymph nodes are present at the area we palpated the small mass.  Radiology report states that these appear to be benign/not worrisome at this time.  However, we would consider further imaging with a CT if these lesions persist or grow, or become painful > would encourage patient to follow-up in 8-12 weeks with provider appointment, if lymph nodes are still present, sooner if she appreciates growth or they become painful.  If they resolve over this time or continue to at least get smaller, no follow-up is needed.

## 2022-02-23 ENCOUNTER — Encounter: Payer: BC Managed Care – PPO | Admitting: Family Medicine

## 2022-02-23 ENCOUNTER — Other Ambulatory Visit: Payer: Self-pay

## 2022-02-23 ENCOUNTER — Ambulatory Visit (INDEPENDENT_AMBULATORY_CARE_PROVIDER_SITE_OTHER): Payer: BC Managed Care – PPO | Admitting: Family Medicine

## 2022-02-23 ENCOUNTER — Encounter: Payer: Self-pay | Admitting: Family Medicine

## 2022-02-23 VITALS — BP 101/70 | HR 86 | Temp 98.0°F | Ht 63.5 in | Wt 112.0 lb

## 2022-02-23 DIAGNOSIS — Z0000 Encounter for general adult medical examination without abnormal findings: Secondary | ICD-10-CM

## 2022-02-23 DIAGNOSIS — Z789 Other specified health status: Secondary | ICD-10-CM

## 2022-02-23 DIAGNOSIS — Z111 Encounter for screening for respiratory tuberculosis: Secondary | ICD-10-CM | POA: Diagnosis not present

## 2022-02-23 NOTE — Progress Notes (Signed)
Office Note ?02/23/2022 ? ?CC:  ?Chief Complaint  ?Patient presents with  ? Annual Exam  ?  Attending [employer] PA program in May.   ? ?HPI:  ?Patient is a 26 y.o. female who is here for annual health maintenance exam. ?Her PCP is Dr. Raoul Pitch and I am seeing pt today in Dr. Lucita Lora absence. ? ?Feeling well. ?Will be attending PA school in [facility] [employer] [facility] soon. 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 ? ?Has history of enlarged right submandibular lymph node.  Imaged about 6 weeks ago.  It has not changed. ? ?Past Medical History:  ?Diagnosis Date  ? Myopia of both eyes   ? ? ?Past Surgical History:  ?Procedure Laterality Date  ? WISDOM TOOTH EXTRACTION    ? ? ?Family History  ?Problem Relation Age of Onset  ? Breast cancer Mother 61  ? Hypertension Mother   ? Skin cancer Mother   ? Hyperlipidemia Father   ? Hypertension Father   ? Arthritis Maternal Grandmother   ? Skin cancer Maternal Grandfather   ? Arthritis Paternal Grandmother   ? Diabetes Paternal Grandmother   ? Prostate cancer Paternal Grandfather   ? Diabetes Paternal Grandfather   ? Hypertension Paternal Grandfather   ? Hyperlipidemia Paternal Grandfather   ? Heart disease Paternal Grandfather   ? ? ?Social History  ? ?Socioeconomic History  ? Marital status: Single  ?  Spouse name: Not on file  ? Number of children: Not on file  ? Years of education: Not on file  ? Highest education level: Not on file  ?Occupational History  ? Not on file  ?Tobacco Use  ? Smoking status: Never  ? Smokeless tobacco: Never  ?Vaping Use  ? Vaping Use: Never used  ?Substance and Sexual Activity  ? Alcohol use: Not Currently  ? Drug use: Never  ? Sexual activity: Yes  ?  Partners: Male  ?  Birth control/protection: None  ?  Comment: 1st intercourse 26 yo-Fewer than 5 partners  ?Other Topics Concern  ? Not on file  ?Social History Narrative  ? Marital status/children/pets: Single.   ? Education/employment: Electronics engineer.   ? Safety:   ?   -Wears a  bicycle helmet riding a bike: Yes  ?   -smoke alarm in the home:Yes  ?   - wears seatbelt: Yes  ?   - Feels safe in their relationships: Yes  ? ?Social Determinants of Health  ? ?Financial Resource Strain: Not on file  ?Food Insecurity: Not on file  ?Transportation Needs: Not on file  ?Physical Activity: Not on file  ?Stress: Not on file  ?Social Connections: Not on file  ?Intimate Partner Violence: Not on file  ? ? ?Outpatient Medications Prior to Visit  ?Medication Sig Dispense Refill  ? Levonorgestrel (SKYLA) 13.5 MG IUD by Intrauterine route.    ? ?No facility-administered medications prior to visit.  ? ? ?No Known Allergies ? ?ROS ?Review of Systems  ?Constitutional:  Negative for appetite change, chills, fatigue and fever.  ?HENT:  Negative for congestion, dental problem, ear pain and sore throat.   ?Eyes:  Negative for discharge, redness and visual disturbance.  ?Respiratory:  Negative for cough, chest tightness, shortness of breath and wheezing.   ?Cardiovascular:  Negative for chest pain, palpitations and leg swelling.  ?Gastrointestinal:  Negative for abdominal pain, blood in stool, diarrhea, nausea and vomiting.  ?Genitourinary:  Negative for difficulty urinating, dysuria, flank pain, frequency, hematuria and urgency.  ?  Musculoskeletal:  Negative for arthralgias, back pain, joint swelling, myalgias and neck stiffness.  ?Skin:  Negative for pallor and rash.  ?Neurological:  Negative for dizziness, speech difficulty, weakness and headaches.  ?Hematological:  Negative for adenopathy. Does not bruise/bleed easily.  ?Psychiatric/Behavioral:  Negative for confusion and sleep disturbance. The patient is not nervous/anxious.   ? ?PE; ? ?  02/23/2022  ?  3:42 PM 01/12/2022  ?  9:57 AM 01/17/2021  ? 12:18 PM  ?Vitals with BMI  ?Height 5' 3.5" 5\' 2"    ?Weight 112 lbs 115 lbs   ?BMI 19.53 21.03   ?Systolic [id number]  ?Diastolic 70 78 70  ?Pulse 86 98 64  ? ?Exam chaperoned by Kavin Leech, CMA. ? ?Gen: Alert, well  appearing.  Patient is oriented to person, place, time, and situation. ?AFFECT: pleasant, lucid thought and speech. ?ENT: Ears: EACs clear, normal epithelium.  TMs with good light reflex and landmarks bilaterally.  Eyes: no injection, icteris, swelling, or exudate.  EOMI, PERRLA. ?Nose: no drainage or turbinate edema/swelling.  No injection or focal lesion.  Mouth: lips without lesion/swelling.  Oral mucosa pink and moist.  Dentition intact and without obvious caries or gingival swelling.  Oropharynx without erythema, exudate, or swelling.  ?Neck: supple/nontender.  R submandibular L node firm, small, nontender. ?mass, or TM.  Carotid pulses 2+ bilaterally, without bruits. ?CV: RRR, no m/r/g.   ?LUNGS: CTA bilat, nonlabored resps, good aeration in all lung fields. ?ABD: soft, NT, ND, BS normal.  No hepatospenomegaly or mass.  No bruits. ?EXT: no clubbing, cyanosis, or edema.  ?Musculoskeletal: no joint swelling, erythema, warmth, or tenderness.  ROM of all joints intact. ?Skin - no sores or suspicious lesions or rashes or color changes ? ?Pertinent labs:  ?No results found for: TSH ?Lab Results  ?Component Value Date  ? WBC 6.4 01/12/2022  ? HGB 13.3 01/12/2022  ? HCT 39.9 01/12/2022  ? MCV 93.0 01/12/2022  ? PLT 308.0 01/12/2022  ? ?Lab Results  ?Component Value Date  ? CREATININE 0.70 06/19/2020  ? BUN 10 06/19/2020  ? NA 137 06/19/2020  ? K 4.1 06/19/2020  ? CL 105 06/19/2020  ? CO2 22 06/19/2020  ? ?Lab Results  ?Component Value Date  ? ALT 19 06/19/2020  ? AST 19 06/19/2020  ? BILITOT 0.6 06/19/2020  ? ?Lab Results  ?Component Value Date  ? CHOL 149 06/19/2020  ? ?Lab Results  ?Component Value Date  ? HDL 66 06/19/2020  ? ?Lab Results  ?Component Value Date  ? [place] 65 06/19/2020  ? ?Lab Results  ?Component Value Date  ? TRIG 98 06/19/2020  ? ?Lab Results  ?Component Value Date  ? CHOLHDL 2.3 06/19/2020  ? ?Lab Results  ?Component Value Date  ? HGBA1C 4.8 06/19/2020  ? ?ASSESSMENT AND PLAN:  ? ?Health  maintenance exam: ?Reviewed age and gender appropriate health maintenance issues (prudent diet, regular exercise, health risks of tobacco and excessive alcohol, use of seatbelts, fire alarms in home, use of sunscreen).  Also reviewed age and gender appropriate health screening as well as vaccine recommendations. ?Vaccines: all UTD. ?Labs: Hep B ab titer as required by her PA school.  ?Cervical ca screening: per GYN. ? ?R submandibular lymph node enlargement--unchanged. ?She has plan for f/u with Dr. Raoul Pitch if not resolved in a couple weeks (has been about 6 wks since the ultrasound). ? ?An After Visit Summary was printed and given to the patient. ? ?FOLLOW UP:  Return in about  1 year (around 02/24/2023) for annual CPE (fasting). ? ?Signed:  Crissie Sickles, MD           02/23/2022 ? ?

## 2022-02-23 NOTE — Patient Instructions (Signed)

## 2022-02-27 LAB — QUANTIFERON-TB GOLD PLUS
Mitogen-NIL: >10 IU/mL
NIL: 1.53 IU/mL
QuantiFERON-TB Gold Plus: NEGATIVE
TB1-NIL: 0.08 IU/mL
TB2-NIL: 0.07 IU/mL

## 2022-02-27 LAB — HEPATITIS B SURFACE ANTIBODY, QUANTITATIVE: Hep B S AB Quant (Post): >1000 m[IU]/mL (ref >=10)

## 2022-04-29 IMAGING — US US SOFT TISSUE HEAD/NECK
1 series · 14 of 18 positions shown · non-contrast
Comparison: None.

CLINICAL DATA: Pea-sized hard non mobile mass at right mandibular
angle

EXAM:
ULTRASOUND OF HEAD/NECK SOFT TISSUES
TECHNIQUE: Ultrasound examination of the head and neck soft tissues was
performed in the area of clinical concern.

[Series 1: us soft tissue head/neck · 18 acquisitions, 14 frames shown]
[im 1/18]
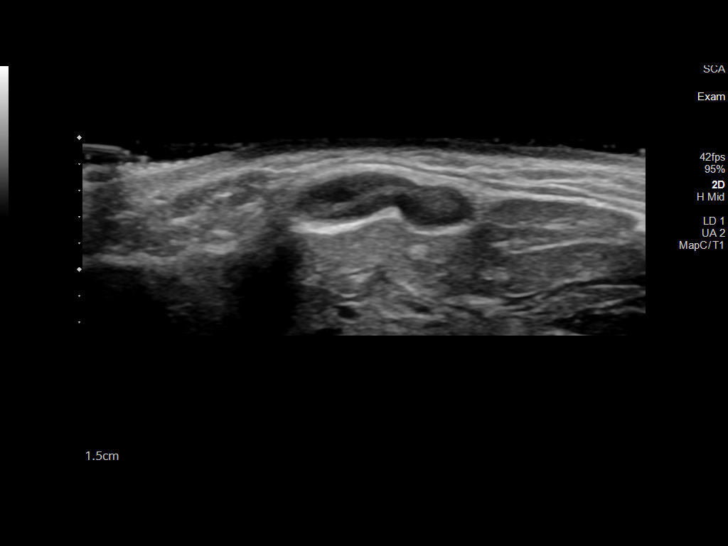
[im 2/18]
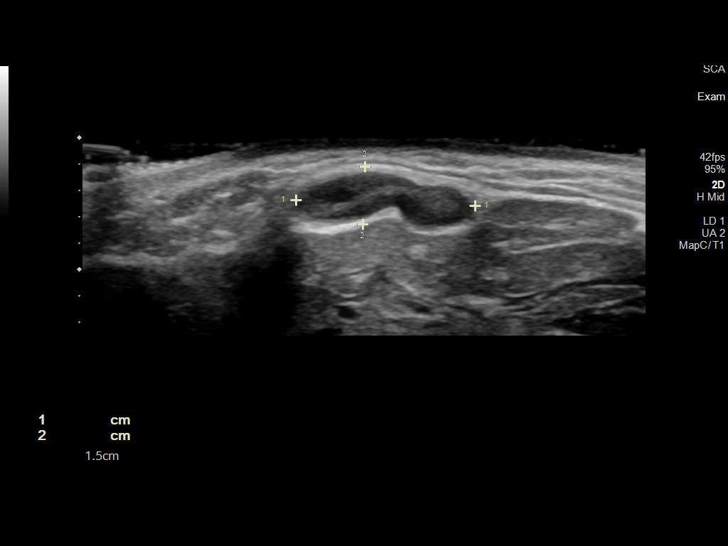
[im 4/18]
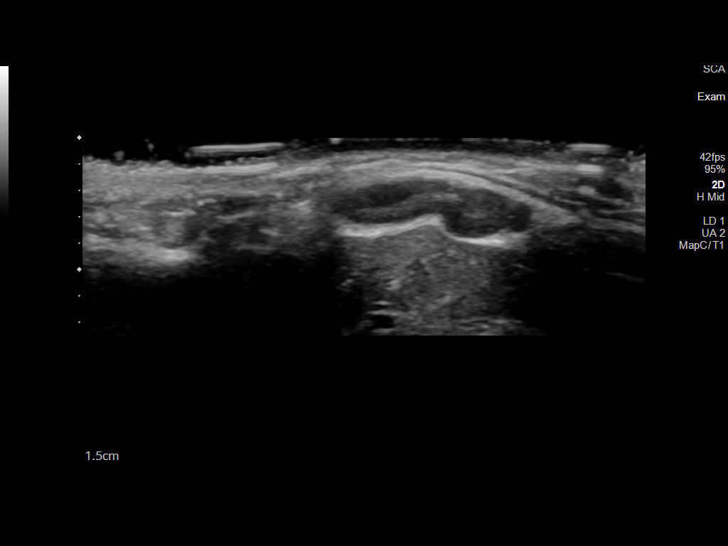
[im 5/18]
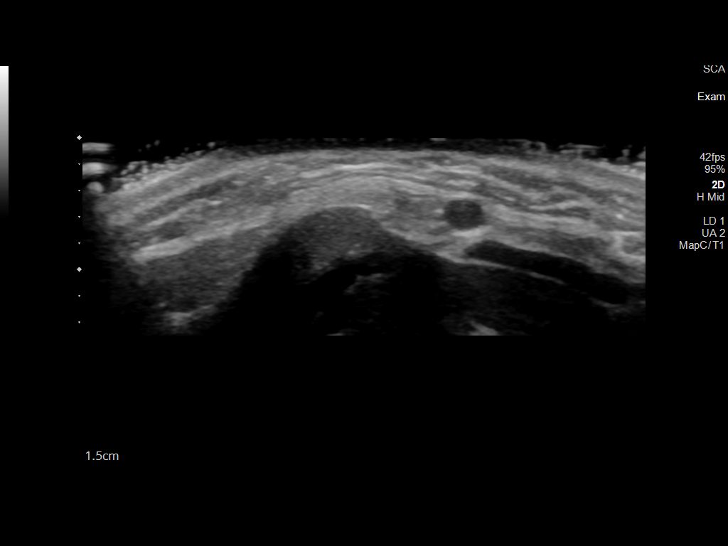
[im 6/18]
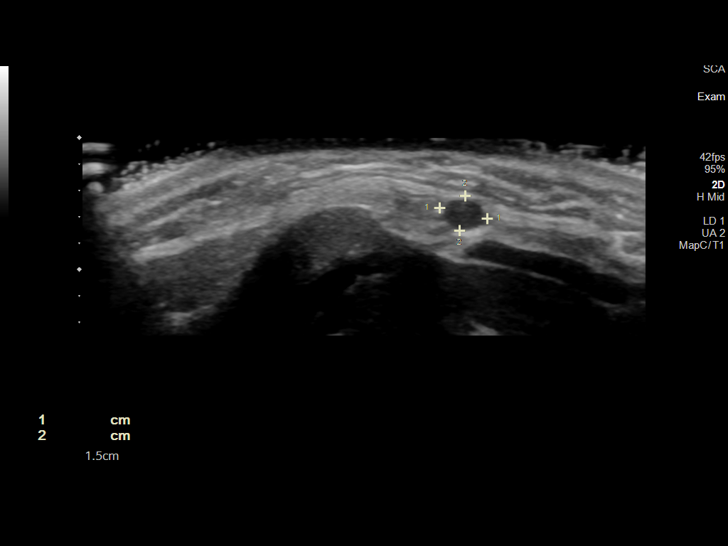
[im 8/18]
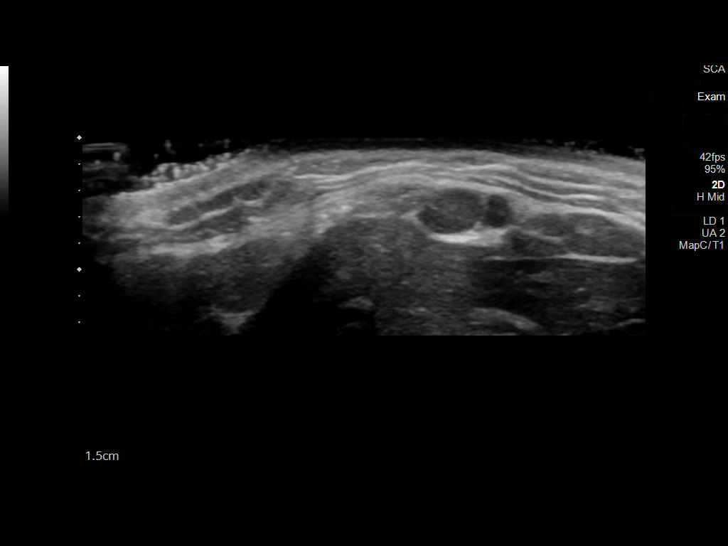
[im 9/18]
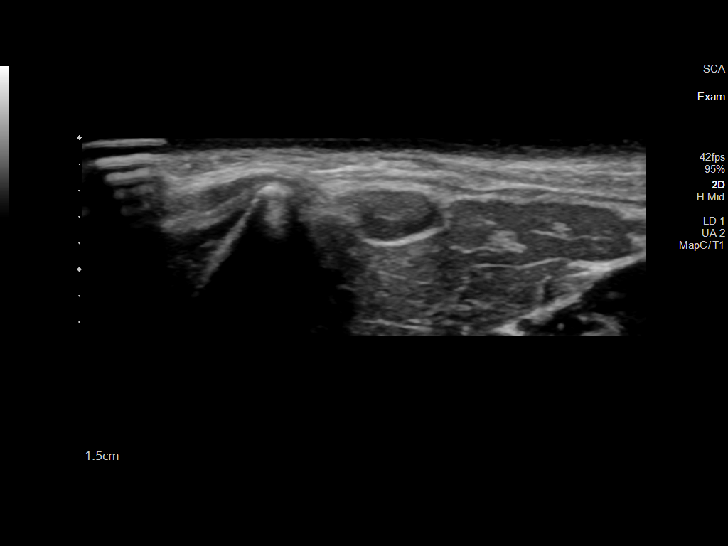
[im 10/18]
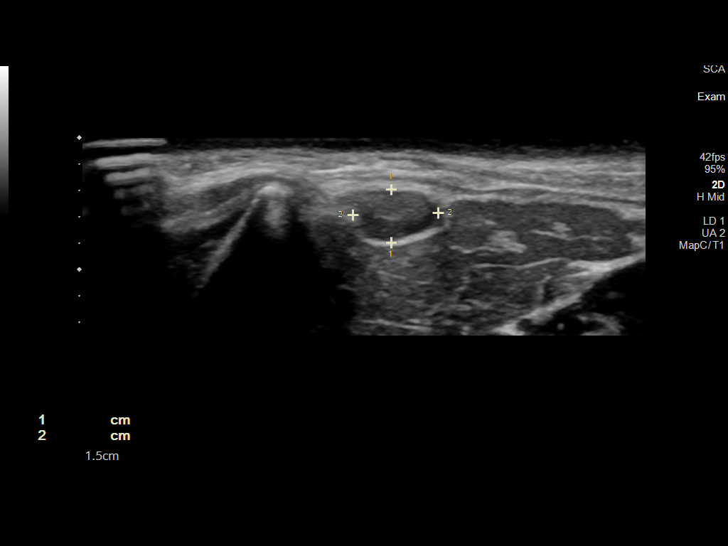
[im 11/18]
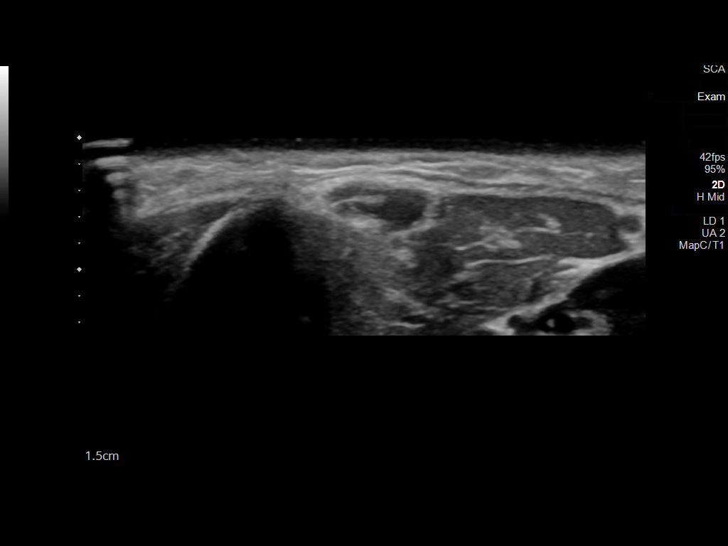
[im 13/18]
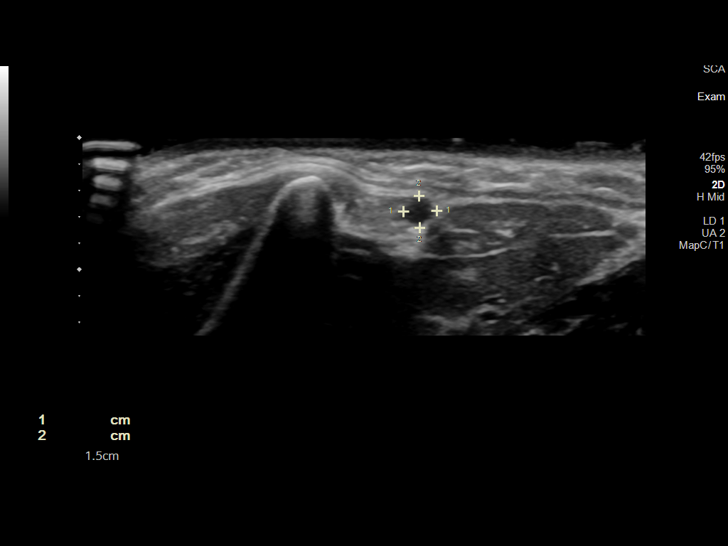
[im 14/18]
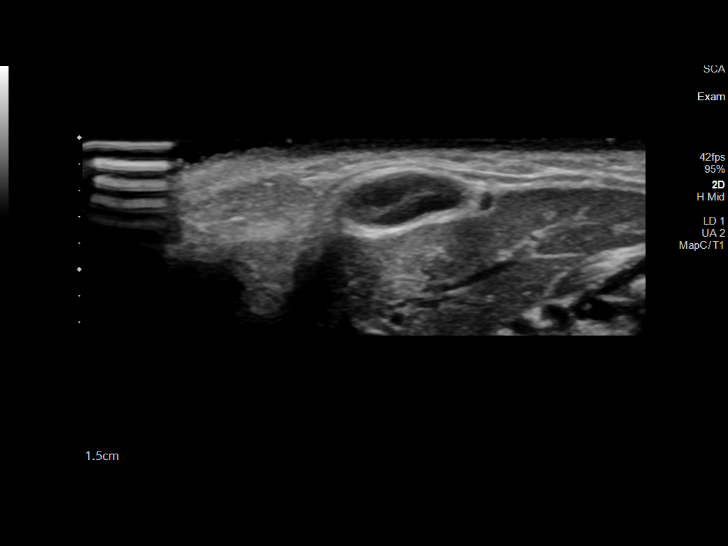
[im 15/18]
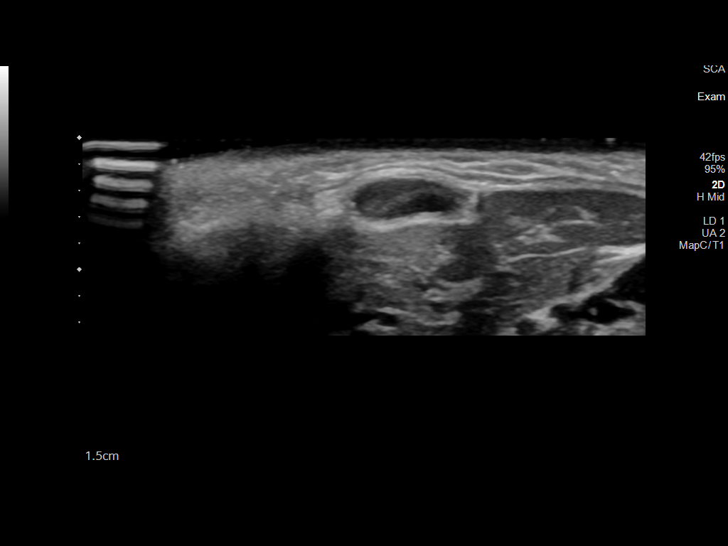
[im 17/18]
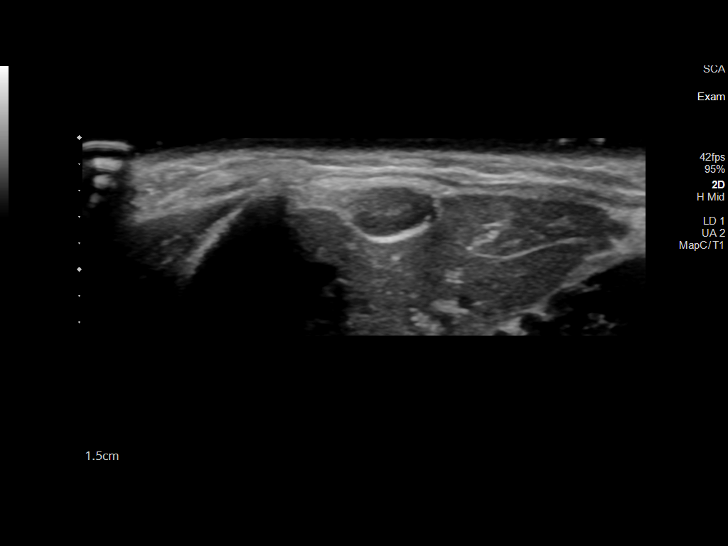
[im 18/18]
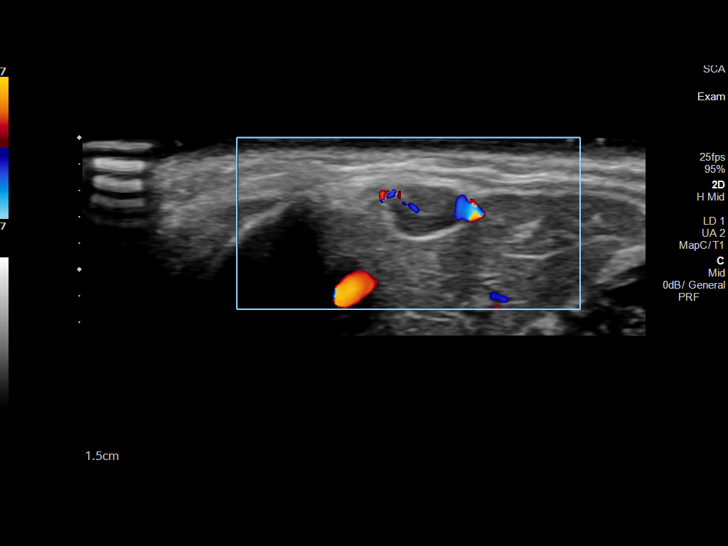

[14 of 18 positions shown; findings below may reference images not displayed]

FINDINGS: There are 2 solid lesions inferior to the angle of the right
mandible in the area of clinical concern measuring 1.4 cm x 0.4 cm x
0.4 cm and 0.4 cm x 0.7 cm x 0.2 cm. These lesions are
well-circumscribed. These lesions are felt most likely to reflect
lymph nodes, which are not pathologically enlarged by short axis
criteria.
IMPRESSION: Two small solid lesions in the area of clinical concern inferior to
the right mandible are favored to reflect lymph nodes, not
pathologically enlarged by size criteria. Consider further imaging
with CT if these lesions persist or growth, or become painful.

## 2022-05-14 ENCOUNTER — Encounter: Payer: Self-pay | Admitting: Family Medicine

## 2022-05-14 ENCOUNTER — Ambulatory Visit (INDEPENDENT_AMBULATORY_CARE_PROVIDER_SITE_OTHER): Payer: Federal, State, Local not specified - PPO | Admitting: Family Medicine

## 2022-05-14 VITALS — BP 123/74 | HR 75 | Temp 98.2°F | Ht 63.5 in | Wt 116.0 lb

## 2022-05-14 DIAGNOSIS — R221 Localized swelling, mass and lump, neck: Secondary | ICD-10-CM | POA: Diagnosis not present

## 2022-05-14 DIAGNOSIS — D72820 Lymphocytosis (symptomatic): Secondary | ICD-10-CM | POA: Diagnosis not present

## 2022-05-14 LAB — CBC WITH DIFFERENTIAL/PLATELET
Basophils Absolute: 0.1 10*3/uL (ref 0.0–0.1)
Basophils Relative: 1.2 % (ref 0.0–3.0)
Eosinophils Absolute: 0.1 10*3/uL (ref 0.0–0.7)
Eosinophils Relative: 1.4 % (ref 0.0–5.0)
HCT: 43.4 % (ref 36.0–46.0)
Hemoglobin: 14.2 g/dL (ref 12.0–15.0)
Lymphocytes Relative: 47.6 % — ABNORMAL HIGH (ref 12.0–46.0)
Lymphs Abs: 2.6 10*3/uL (ref 0.7–4.0)
MCHC: 32.8 g/dL (ref 30.0–36.0)
MCV: 94.7 fl (ref 78.0–100.0)
Monocytes Absolute: 0.4 10*3/uL (ref 0.1–1.0)
Monocytes Relative: 7 % (ref 3.0–12.0)
Neutro Abs: 2.3 10*3/uL (ref 1.4–7.7)
Neutrophils Relative %: 42.8 % — ABNORMAL LOW (ref 43.0–77.0)
Platelets: 344 10*3/uL (ref 150.0–400.0)
RBC: 4.58 Mil/uL (ref 3.87–5.11)
RDW: 13.3 % (ref 11.5–15.5)
WBC: 5.4 10*3/uL (ref 4.0–10.5)

## 2022-05-14 NOTE — Progress Notes (Signed)
Martha Small , [birth year], 26 y.o., female MRN: [id number] Patient Care Team    Relationship Specialty Notifications Start End  Natalia Leatherwood, DO PCP - General Family Medicine  05/16/19   Jethro Bolus, MD Consulting Physician Ophthalmology  05/16/19     Chief Complaint  Patient presents with   Mass    Pt c/o mass on neck x 4.5 mos; no changes     Subjective: Martha Small is a 26 y.o. female present for follow-up on right lymphadenopathy.  Ultrasound was completed in February which resulted with small benign appearing lymph nodes present x2.  Patient reports she has appreciated no growth over the last 4 months or tenderness of her lymph nodes.  She did recall she had a lymph node in her right groin that is very small as well, without any growth.  She had had lymphocytosis very mild on prior labs.  However on last lab her CBC with differential was completely normal.  Prior note Pt presents for an OV with complaints of right jaw mass of 2.5 weeks. She noticed one day in the mirror and realized she did not have one on the other side. She reports it is uncomfortable if she presses on it, otherwise it is not painful.  She has never had anything like this in the past.  She has a h/o mild lymphocytosis 2021, she forgot to return for follow up labs.  She denies night sweats, unintentional weight loss, fever, chills or rash.  She denies any recent injury to area or illness.      02/23/2022    3:48 PM 01/12/2022    9:56 AM 06/19/2020    1:50 PM 05/16/2019   11:08 AM  Depression screen PHQ 2/9  Decreased Interest 0 0 0 0  Down, Depressed, Hopeless 0 0 0 0  PHQ - 2 Score 0 0 0 0  Altered sleeping   0   Tired, decreased energy   0   Change in appetite   0   Feeling bad or failure about yourself    0   Trouble concentrating   0   Moving slowly or fidgety/restless   0   Suicidal thoughts   0   PHQ-9 Score   0   Difficult doing work/chores   Not difficult at all     No  Known Allergies Social History   Social History Narrative   Marital status/children/pets: Single.    Education/employment: Archivist.    Safety:      -Wears a bicycle helmet riding a bike: Yes     -smoke alarm in the home:Yes     - wears seatbelt: Yes     - Feels safe in their relationships: Yes   Past Medical History:  Diagnosis Date   Myopia of both eyes    Past Surgical History:  Procedure Laterality Date   WISDOM TOOTH EXTRACTION     Family History  Problem Relation Age of Onset   Breast cancer Mother 34   Hypertension Mother    Skin cancer Mother    Hyperlipidemia Father    Hypertension Father    Arthritis Maternal Grandmother    Skin cancer Maternal Grandfather    Arthritis Paternal Grandmother    Diabetes Paternal Grandmother    Prostate cancer Paternal Grandfather    Diabetes Paternal Grandfather    Hypertension Paternal Grandfather    Hyperlipidemia Paternal Grandfather    Heart disease Paternal Grandfather    Allergies  as of 05/14/2022   No Known Allergies      Medication List        Accurate as of May 14, 2022 10:51 AM. If you have any questions, ask your nurse or doctor.          Skyla 13.5 MG Iud Generic drug: Levonorgestrel by Intrauterine route.        All past medical history, surgical history, allergies, family history, immunizations andmedications were updated in the EMR today and reviewed under the history and medication portions of their EMR.     ROS Negative, with the exception of above mentioned in HPI   Objective:  BP 123/74   Pulse 75   Temp 98.2 F (36.8 C) (Oral)   Ht 5' 3.5" (1.613 m)   Wt 116 lb (52.6 kg)   SpO2 100%   BMI 20.23 kg/m  Body mass index is 20.23 kg/m. Physical Exam Neck:     Gen: Afebrile. No acute distress.  HENT: AT. [place].  Eyes:Pupils Equal Round Reactive to light, Extraocular movements intact,  Conjunctiva without redness, discharge or icterus. Neck/lymp/endocrine: Supple,x2 small  hard, nonmobile nodes right- submandibular. lymphadenopathy Neuro: Normal gait. PERLA. EOMi. Alert. Oriented x3 Psych: Normal affect, dress and demeanor. Normal speech. Normal thought content and judgment.  No results found. No results found. No results found for this or any previous visit (from the past 24 hour(s)).  Assessment/Plan: Martha Small is a 26 y.o. female present for OV for  Lymphadenopathy/leukocytosis Stable- no changes to size and not tender. X2 node still present.  [place] reported benign appearing nodes x2.  Will continue to monitor- pt reassured as long as they remain stable and no growth is appreciated then we will monitor at her physicals yearly. Will recheck CBC today to ensure accuracy and lymphocytosis was resolved.  If you notices changes - growth, tenderness etc- then we would see her sooner. Pt reports understanding.   Reviewed expectations re: course of current medical issues. Discussed self-management of symptoms. Outlined signs and symptoms indicating need for more acute intervention. Patient verbalized understanding and all questions were answered. Patient received an After-Visit Summary.    Orders Placed This Encounter  Procedures   CBC w/Diff    No orders of the defined types were placed in this encounter.  Referral Orders  No referral(s) requested today     Note is dictated utilizing voice recognition software. Although note has been proof read prior to signing, occasional typographical errors still can be missed. If any questions arise, please do not hesitate to call for verification.   electronically signed by:  Felix Pacini, DO  [facility] - [place]

## 2022-05-14 NOTE — Patient Instructions (Signed)
Lymphadenopathy  Lymphadenopathy means that your lymph glands are swollen or larger than normal. Lymph glands, also called lymph nodes, are collections of tissue that filter excess fluid, bacteria, viruses, and waste from your bloodstream. They are part of your body's disease-fighting system (immune system), which protects your body from germs. There may be different causes of lymphadenopathy, depending on where it is in your body. Some types go away on their own. Lymphadenopathy can occur anywhere that you have lymph glands, including these areas: Neck (cervical lymphadenopathy). Chest (mediastinal lymphadenopathy). Lungs (hilar lymphadenopathy). Underarms (axillary lymphadenopathy). Groin (inguinal lymphadenopathy). When your immune system responds to germs, infection-fighting cells and fluid build up in your lymph glands. This causes some swelling and enlargement. If the lymph nodes do not go back to normal size after you have an infection or disease, your health care provider may do tests. These tests help to monitor your condition and find the reason why the glands are still swollen and enlarged. Follow these instructions at home:  Get plenty of rest. Your health care provider may recommend over-the-counter medicines for pain. Take over-the-counter and prescription medicines only as told by your health care provider. If directed, apply heat to swollen lymph glands as often as told by your health care provider. Use the heat source that your health care provider recommends, such as a moist heat pack or a heating pad. Place a towel between your skin and the heat source. Leave the heat on for 20-30 minutes. Remove the heat if your skin turns bright red. This is especially important if you are unable to feel pain, heat, or cold. You may have a greater risk of getting burned. Check your affected lymph glands every day for changes. Check other lymph gland areas as told by your health care provider.  Check for changes such as: More swelling. Sudden increase in size. Redness or pain. Hardness. Keep all follow-up visits. This is important. Contact a health care provider if you have: Lymph glands that: Are still swollen after 2 weeks. Have suddenly gotten bigger or the swelling spreads. Are red, painful, or hard. Fluid leaking from the skin near an enlarged lymph gland. Problems with breathing. A fever, chills, or night sweats. Fatigue. A sore throat. Pain in your abdomen. Weight loss. Get help right away if you have: Severe pain. Chest pain. Shortness of breath. These symptoms may represent a serious problem that is an emergency. Do not wait to see if the symptoms will go away. Get medical help right away. Call your local emergency services (911 in the [place] Do not drive yourself to the hospital. Summary Lymphadenopathy means that your lymph glands are swollen or larger than normal. Lymph glands, also called lymph nodes, are collections of tissue that filter excess fluid, bacteria, viruses, and waste from the bloodstream. They are part of your body's disease-fighting system (immune system). Lymphadenopathy can occur anywhere that you have lymph glands. If the lymph nodes do not go back to normal size after you have an infection or disease, your health care provider may do tests to monitor your condition and find the reason why the glands are still swollen and enlarged. Check your affected lymph glands every day for changes. Check other lymph gland areas as told by your health care provider. This information is not intended to replace advice given to you by your health care provider. Make sure you discuss any questions you have with your health care provider. Document Revised: 09/11/2020 Document Reviewed: 09/11/2020 Elsevier Patient Education    2023 [employer]  

## 2022-06-11 ENCOUNTER — Encounter: Payer: Self-pay | Admitting: Nurse Practitioner

## 2022-06-11 ENCOUNTER — Ambulatory Visit (INDEPENDENT_AMBULATORY_CARE_PROVIDER_SITE_OTHER): Payer: Federal, State, Local not specified - PPO | Admitting: Nurse Practitioner

## 2022-06-11 ENCOUNTER — Other Ambulatory Visit (HOSPITAL_COMMUNITY)
Admission: RE | Admit: 2022-06-11 | Discharge: 2022-06-11 | Disposition: A | Discharge status: Home / Self Care | Payer: Federal, State, Local not specified - PPO | Source: Ambulatory Visit | Attending: Nurse Practitioner | Admitting: Nurse Practitioner

## 2022-06-11 VITALS — BP 122/72 | HR 97 | Resp 20 | Ht 63.19 in | Wt 116.8 lb

## 2022-06-11 DIAGNOSIS — Z3161 Procreative counseling and advice using natural family planning: Secondary | ICD-10-CM | POA: Diagnosis not present

## 2022-06-11 DIAGNOSIS — Z30432 Encounter for removal of intrauterine contraceptive device: Secondary | ICD-10-CM

## 2022-06-11 DIAGNOSIS — Z01419 Encounter for gynecological examination (general) (routine) without abnormal findings: Secondary | ICD-10-CM

## 2022-06-11 DIAGNOSIS — Z308 Encounter for other contraceptive management: Secondary | ICD-10-CM

## 2022-06-11 NOTE — Progress Notes (Signed)
   Martha Small July 22, 1996 [id number]   History:  26 y.o. G0 presents for annual exam without GYN complaints. Martha Small 12/2020, wants removed today. Plans to start natural family planning. Normal pap history. Gardasil series completed.    Gynecologic History Patient's last menstrual period was 05/28/2022 (approximate). Period Duration (Days): 5 Period Pattern: Regular Menstrual Flow: Light Menstrual Control: Tampon Menstrual Control Change Freq (Hours): 8 Dysmenorrhea: None Contraception/Family planning: abstinence and Small Sexually active: No  Health Maintenance Last Pap: 12/11/2019. Results were: Normal Last mammogram: Not indicated Last colonoscopy: Not indicated Last Dexa: Not indicated  Past medical history, past surgical history, family history and social history were all reviewed and documented in the EPIC chart. In [employer] school.   ROS:  A ROS was performed and pertinent positives and negatives are included.  Exam:  Vitals:   06/11/22 1559  BP: 122/72  Pulse: 97  Resp: 20  SpO2: 98%  Weight: 116 lb 12.8 oz (53 kg)  Height: 5' 3.19" (1.605 m)    Body mass index is 20.57 kg/m.  General appearance:  Normal Thyroid:  Symmetrical, normal in size, without palpable masses or nodularity. Respiratory  Auscultation:  Clear without wheezing or rhonchi Cardiovascular  Auscultation:  Regular rate, without rubs, murmurs or gallops  Edema/varicosities:  Not grossly evident Abdominal  Soft,nontender, without masses, guarding or rebound.  Liver/spleen:  No organomegaly noted  Hernia:  None appreciated  Skin  Inspection:  Grossly normal   Breasts: Not indicated per guidelines Genitourinary   Inguinal/mons:  Normal without inguinal adenopathy  External genitalia:  Normal appearing vulva with no masses, tenderness, or lesions  BUS/Urethra/Skene's glands:  Normal  Vagina:  Normal appearing with normal color and discharge, no lesions  Cervix:  Normal appearing without  discharge or lesions. Small string visible  Uterus:  Normal in size, shape and contour.  Midline and mobile, nontender  Adnexa/parametria:     Rt: Normal in size, without masses or tenderness.   Lt: Normal in size, without masses or tenderness.  Anus and perineum: Normal  Digital rectal exam: Not indicated  Patient informed chaperone available to be present for breast and pelvic exam. Patient has requested no chaperone to be present. Patient has been advised what will be completed during breast and pelvic exam.   Assessment/Plan:  26 y.o. G0 for annual exam.   Well female exam with routine gynecological exam - Plan: Cytology - PAP( [place]). Education provided on SBEs, importance of preventative screenings, current guidelines, high calcium diet, regular exercise, and multivitamin daily.   Encounter for Small removal - Small removed with ease, shown to patient and discarded.   Counseling about natural family planning - Plans to download app for tracking. Discussed monitoring cervical mucous changes and temperature.   Screening for cervical cancer - Normal pap history. Pap today.  Follow up in 1 year for annual.     Martha Small [facility], 4:19 PM 06/11/2022

## 2022-06-15 LAB — CYTOLOGY - PAP: Diagnosis: NEGATIVE

## 2022-06-25 ENCOUNTER — Encounter: Payer: Self-pay | Admitting: Nurse Practitioner

## 2022-07-26 DIAGNOSIS — L239 Allergic contact dermatitis, unspecified cause: Secondary | ICD-10-CM | POA: Diagnosis not present

## 2022-08-11 ENCOUNTER — Encounter: Payer: Self-pay | Admitting: Family Medicine

## 2022-08-11 ENCOUNTER — Ambulatory Visit: Payer: Federal, State, Local not specified - PPO | Admitting: Family Medicine

## 2022-08-11 VITALS — BP 100/65 | HR 84 | Temp 98.0°F | Ht 63.5 in | Wt 124.0 lb

## 2022-08-11 DIAGNOSIS — L2082 Flexural eczema: Secondary | ICD-10-CM | POA: Diagnosis not present

## 2022-08-11 MED ORDER — FLUOCINONIDE 0.05 % EX CREA: 1.0000 | TOPICAL_CREAM | Freq: Two times a day (BID) | CUTANEOUS | 1 refills | Status: AC

## 2022-08-11 NOTE — Patient Instructions (Signed)
Eczema Eczema refers to a group of skin conditions that cause skin to become rough and inflamed. Each type of eczema has different triggers, symptoms, and treatments. Eczema of any type is usually itchy. Symptoms range from mild to severe. Eczema is not spread from person to person (is not contagious). It can appear on different parts of the body at different times. One person's eczema may look different from another person's eczema. What are the causes? The exact cause of this condition is not known. However, exposure to certain environmental factors, irritants, and allergens can make the condition worse. What are the signs or symptoms? Symptoms of this condition depend on the type of eczema you have. The types include: Contact dermatitis. There are two kinds: Irritant contact dermatitis. This happens when something irritates the skin and causes a rash. Allergic contact dermatitis. This happens when your skin comes in contact with something you are allergic to (allergens). This can include poison ivy, chemicals, or medicines that were applied to your skin. Atopic dermatitis. This is a long-term (chronic) skin disease that keeps coming back (recurring). It is the most common type of eczema. Usual symptoms are a red rash and itchy, dry, scaly skin. It usually starts showing signs in infancy and can last through adulthood. Dyshidrotic eczema. This is a form of eczema on the hands and feet. It shows up as very itchy, fluid-filled blisters. It can affect people of any age but is more common before age 40. Hand eczema. This causes very itchy areas of skin on the palms and sides of the hands and fingers. This type of eczema is common in industrial jobs where you may be exposed to different types of irritants. Lichen simplex chronicus. This type of eczema occurs when a person constantly scratches one area of the body. Repeated scratching of the area leads to thickened skin (lichenification). This condition can  accompany other types of eczema. It is more common in adults but may also be seen in children. Nummular eczema. This is a common type of eczema that most often affects the lower legs and the backs of the hands. It typically causes an itchy, red, circular, crusty lesion (plaque). Scratching may become a habit and can cause bleeding. Nummular eczema occurs most often in middle-aged or older people. Seborrheic dermatitis. This is a common skin disease that mainly affects the scalp. It may also affect other oily areas of the body, such as the face, sides of the nose, eyebrows, ears, eyelids, and chest. It is marked by small scaling and redness of the skin (erythema). This can affect people of all ages. In infants, this condition is called cradle cap. Stasis dermatitis. This is a common skin disease that can cause itching, scaling, and hyperpigmentation, usually on the legs and feet. It occurs most often in people who have a condition that prevents blood from being pumped through the veins in the legs (chronic venous insufficiency). Stasis dermatitis is a chronic condition that needs long-term management. How is this diagnosed? This condition may be diagnosed based on: A physical exam of your skin. Your medical history. Skin patch tests. These tests involve using patches that contain possible allergens and placing them on your back. Your health care provider will check in a few days to see if an allergic reaction occurred. How is this treated? Treatment for eczema is based on the type of eczema you have. You may be given hydrocortisone steroid medicine or antihistamines. These can relieve itching quickly and help reduce inflammation.   These may be prescribed or purchased over the counter, depending on the strength that is needed. Follow these instructions at home: Take or apply over-the-counter and prescription medicines only as told by your health care provider. Use creams or ointments to moisturize your  skin. Do not use lotions. Learn what triggers or irritates your symptoms so you can avoid these things. Treat symptom flare-ups quickly. Do not scratch your skin. This can make your rash worse. Keep all follow-up visits. This is important. Where to find more information American Academy of Dermatology: aad.org National Eczema Association: nationaleczema.org The Society for Pediatric Dermatology: pedsderm.net Contact a health care provider if: You have severe itching, even with treatment. You scratch your skin regularly until it bleeds. Your rash looks different than usual. Your skin is painful, swollen, or more red than usual. You have a fever. Summary Eczema refers to a group of skin conditions that cause skin to become rough and inflamed. Each type has different triggers. Eczema of any type causes itching that may range from mild to severe. Treatment varies based on the type of eczema you have. Hydrocortisone steroid medicine or antihistamines can help with itching and inflammation. Protecting your skin is the best way to prevent eczema. Use creams or ointments to moisturize your skin. Avoid triggers and irritants. Treat flare-ups quickly. This information is not intended to replace advice given to you by your health care provider. Make sure you discuss any questions you have with your health care provider. Document Revised: 08/26/2020 Document Reviewed: 08/26/2020 Elsevier Patient Education  2023 Elsevier Inc.  

## 2022-08-11 NOTE — Progress Notes (Signed)
[name] , 1996-05-20, 26 y.o., female MRN: [id number] Patient Care Team    Relationship Specialty Notifications Start End  Natalia Leatherwood, DO PCP - General Family Medicine  05/16/19   Jethro Bolus, MD Consulting Physician Ophthalmology  05/16/19     Chief Complaint  Patient presents with   Rash    Pt c/o rash on arm x 2 mos; rash is currently located on R cubital fossa area but has been axillary and on both arms      Subjective: Pt presents for an OV with complaints of rash on bilateral flexure - antecubital fossa for 2 months. She was seen in the UC last week and provided with a steroid dose pack. She reports the steroid did help with the rash under her left axilla, but not so much on her arm. She has had a history of "eczema" on her pediatric record, but she does not recall being formerly diagnosed. She has a had a rash in this location intermittently over the years.  She reports she has been under more stress of recently with school. She is in [employer] school.      02/23/2022    3:48 PM 01/12/2022    9:56 AM 06/19/2020    1:50 PM 05/16/2019   11:08 AM  Depression screen PHQ 2/9  Decreased Interest 0 0 0 0  Down, Depressed, Hopeless 0 0 0 0  PHQ - 2 Score 0 0 0 0  Altered sleeping   0   Tired, decreased energy   0   Change in appetite   0   Feeling bad or failure about yourself    0   Trouble concentrating   0   Moving slowly or fidgety/restless   0   Suicidal thoughts   0   PHQ-9 Score   0   Difficult doing work/chores   Not difficult at all     No Known Allergies Social History   Social History Narrative   Marital status/children/pets: Single.    Education/employment: Archivist.    Safety:      -Wears a bicycle helmet riding a bike: Yes     -smoke alarm in the home:Yes     - wears seatbelt: Yes     - Feels safe in their relationships: Yes   Past Medical History:  Diagnosis Date   Myopia of both eyes    Past Surgical History:  Procedure  Laterality Date   IUD REMOVAL     LASIK Bilateral 06/2021   WISDOM TOOTH EXTRACTION     Family History  Problem Relation Age of Onset   Breast cancer Mother 56   Hypertension Mother    Skin cancer Mother    Hyperlipidemia Father    Hypertension Father    Arthritis Maternal Grandmother    Skin cancer Maternal Grandfather    Arthritis Paternal Grandmother    Diabetes Paternal Grandmother    Prostate cancer Paternal Grandfather    Diabetes Paternal Grandfather    Hypertension Paternal Grandfather    Hyperlipidemia Paternal Grandfather    Heart disease Paternal Grandfather    Allergies as of 08/11/2022   No Known Allergies      Medication List        Accurate as of August 11, 2022 10:58 AM. If you have any questions, ask your nurse or doctor.          fluocinonide cream 0.05 % Commonly known as: LIDEX Apply 1 Application topically  2 (two) times daily. Started by: Felix Pacini, DO   Skyla 13.5 MG Iud Generic drug: Levonorgestrel by Intrauterine route.        All past medical history, surgical history, allergies, family history, immunizations andmedications were updated in the EMR today and reviewed under the history and medication portions of their EMR.     ROS Negative, with the exception of above mentioned in HPI   Objective:  BP 100/65   Pulse 84   Temp 98 F (36.7 C) (Oral)   Ht 5' 3.5" (1.613 m)   Wt 124 lb (56.2 kg)   LMP 08/01/2022   SpO2 100%   BMI 21.62 kg/m  Body mass index is 21.62 kg/m. Physical Exam Vitals and nursing note reviewed.  Constitutional:      General: She is not in acute distress.    Appearance: Normal appearance. She is normal weight. She is not ill-appearing or toxic-appearing.  Eyes:     Extraocular Movements: Extraocular movements intact.     Conjunctiva/sclera: Conjunctivae normal.     Pupils: Pupils are equal, round, and reactive to light.  Skin:    Findings: Rash present.     Comments: Right anti-cubital  fossa - mildly dry/scaly small red raised lesions in a red based patch.   Neurological:     Mental Status: She is alert and oriented to person, place, and time. Mental status is at baseline.  Psychiatric:        Mood and Affect: Mood normal.        Behavior: Behavior normal.        Thought Content: Thought content normal.        Judgment: Judgment normal.      No results found. No results found. No results found for this or any previous visit (from the past 24 hour(s)).  Assessment/Plan: Martha Small is a 26 y.o. female present for OV for  Flexural eczema Discussed eczema triggers with her today, as well as preventive measures.  Cetaphil cream after showers recommended.  Lidex cream BID to affected area.  F/u Prn  Reviewed expectations re: course of current medical issues. Discussed self-management of symptoms. Outlined signs and symptoms indicating need for more acute intervention. Patient verbalized understanding and all questions were answered. Patient received an After-Visit Summary.    No orders of the defined types were placed in this encounter.  Meds ordered this encounter  Medications   fluocinonide cream (LIDEX) 0.05 %    Sig: Apply 1 Application topically 2 (two) times daily.    Dispense:  60 g    Refill:  1   Referral Orders  No referral(s) requested today     Note is dictated utilizing voice recognition software. Although note has been proof read prior to signing, occasional typographical errors still can be missed. If any questions arise, please do not hesitate to call for verification.   electronically signed by:  Felix Pacini, DO  [facility] [place]

## 2022-09-17 DIAGNOSIS — R300 Dysuria: Secondary | ICD-10-CM | POA: Diagnosis not present

## 2022-09-18 DIAGNOSIS — Z23 Encounter for immunization: Secondary | ICD-10-CM | POA: Diagnosis not present

## 2022-11-09 ENCOUNTER — Ambulatory Visit: Payer: Federal, State, Local not specified - PPO | Admitting: Nurse Practitioner

## 2022-11-09 ENCOUNTER — Encounter: Payer: Self-pay | Admitting: Nurse Practitioner

## 2022-11-09 VITALS — BP 110/68 | HR 100

## 2022-11-09 DIAGNOSIS — N941 Unspecified dyspareunia: Secondary | ICD-10-CM

## 2022-11-09 NOTE — Progress Notes (Signed)
   Acute Office Visit  Subjective:    Patient ID: Martha Small, female    DOB: 1996/02/02, 26 y.o.   MRN: [id number]   HPI 26 y.o. presents today for pain with intercourse x 3 months. Pain occurs in left lower quadrant with deep penetration in all positions except when lying on back. Pain is described as shooting and radiates up left side. Pain does not linger after intercourse, no bleeding with intercourse. New partner x 5 months. Feels she can move hips around and it helps. Denies vaginal, urinary or GI symptoms. No H/O ovarian cysts.    Review of Systems  Constitutional: Negative.   Gastrointestinal:  Positive for abdominal pain (LLQ).  Genitourinary:  Positive for dyspareunia. Negative for vaginal bleeding, vaginal discharge and vaginal pain.       Objective:    Physical Exam Constitutional:      Appearance: Normal appearance.  Abdominal:     Tenderness: There is no abdominal tenderness.  Genitourinary:    General: Normal vulva.     Vagina: Normal.     Cervix: No cervical motion tenderness, discharge, erythema or cervical bleeding.     Uterus: Normal. Not enlarged and not tender.      Adnexa:        Right: No mass or tenderness.         Left: No mass or tenderness.       BP 110/68   Pulse 100   LMP 11/01/2022 (Exact Date)   SpO2 98%  Wt Readings from Last 3 Encounters:  08/11/22 124 lb (56.2 kg)  06/11/22 116 lb 12.8 oz (53 kg)  05/14/22 116 lb (52.6 kg)        Patient informed chaperone available to be present for breast and/or pelvic exam. Patient has requested no chaperone to be present. Patient has been advised what will be completed during breast and pelvic exam.   Assessment & Plan:   Problem List Items Addressed This Visit   None Visit Diagnoses     Dyspareunia in female    -  Primary   Relevant Orders   SureSwab Advanced Vaginitis Plus,TMA      Plan: Vaginitis panel pending. If negative, ultrasound recommended.      [name]  DNP, 2:05 PM 11/09/2022

## 2022-11-10 LAB — SURESWAB® ADVANCED VAGINITIS PLUS,TMA
C. trachomatis RNA, TMA: NOT DETECTED
CANDIDA SPECIES: NOT DETECTED
Candida glabrata: NOT DETECTED
N. gonorrhoeae RNA, TMA: NOT DETECTED
SURESWAB(R) ADV BACTERIAL VAGINOSIS(BV),TMA: NEGATIVE
TRICHOMONAS VAGINALIS (TV),TMA: NOT DETECTED

## 2022-11-11 ENCOUNTER — Other Ambulatory Visit: Payer: Self-pay | Admitting: Nurse Practitioner

## 2022-11-11 DIAGNOSIS — R1032 Left lower quadrant pain: Secondary | ICD-10-CM

## 2022-11-11 DIAGNOSIS — N9412 Deep dyspareunia: Secondary | ICD-10-CM

## 2022-12-03 ENCOUNTER — Ambulatory Visit (INDEPENDENT_AMBULATORY_CARE_PROVIDER_SITE_OTHER): Payer: Federal, State, Local not specified - PPO

## 2022-12-03 ENCOUNTER — Ambulatory Visit (INDEPENDENT_AMBULATORY_CARE_PROVIDER_SITE_OTHER): Payer: Federal, State, Local not specified - PPO | Admitting: Nurse Practitioner

## 2022-12-03 DIAGNOSIS — N9412 Deep dyspareunia: Secondary | ICD-10-CM | POA: Diagnosis not present

## 2022-12-03 DIAGNOSIS — R1032 Left lower quadrant pain: Secondary | ICD-10-CM

## 2022-12-03 NOTE — Progress Notes (Signed)
   Acute Office Visit  Subjective:    Patient ID: Martha Small, female    DOB: 1995-12-19, 27 y.o.   MRN: [id number]   HPI 27 y.o. presents today for ultrasound. Seen 11/09/2022 with complaints of pain with intercourse x 3 months. Pain occurs in left lower quadrant with deep penetration in all positions except when lying on back. Pain is described as shooting and radiates up left side. Pain does not linger after intercourse, no bleeding with intercourse. New partner x 5 months. Feels she can move hips around and it helps. Denies vaginal, urinary or GI symptoms. No H/O ovarian cysts.  Negative STD screening 11/09/22, normal pap 05/2022.    Review of Systems  Constitutional: Negative.   Genitourinary:  Positive for dyspareunia.       Objective:    Physical Exam Constitutional:      Appearance: Normal appearance.   GU: Not indicated  LMP 11/01/2022 (Exact Date)  Wt Readings from Last 3 Encounters:  08/11/22 124 lb (56.2 kg)  06/11/22 116 lb 12.8 oz (53 kg)  05/14/22 116 lb (52.6 kg)        Assessment & Plan:   Problem List Items Addressed This Visit   None Visit Diagnoses     Deep dyspareunia    -  Primary      Vaginal ultrasound: Retroverted uterus, normal size and shape, no myometrial masses.  Thin, symmetrical endometrium - 2.9 mm.  No masses or thickening seen.  Both ovaries mobile, normal size with normal follicle pattern and normal perfusion.  No adnexal masses, no free fluid.   Plan: Reassurance provided on normal ultrasound findings. Will continue to monitor and pay attention to timing and positions.      [place], 3:01 PM 12/03/2022

## 2023-02-22 ENCOUNTER — Encounter: Payer: Self-pay | Admitting: Family Medicine

## 2023-02-24 ENCOUNTER — Ambulatory Visit: Payer: Federal, State, Local not specified - PPO | Admitting: Family Medicine

## 2023-02-24 ENCOUNTER — Encounter: Payer: Self-pay | Admitting: Family Medicine

## 2023-02-24 VITALS — BP 112/56 | HR 54 | Temp 99.0°F | Wt 128.6 lb

## 2023-02-24 DIAGNOSIS — A488 Other specified bacterial diseases: Secondary | ICD-10-CM | POA: Diagnosis not present

## 2023-02-24 MED ORDER — CLINDAMYCIN PHOSPHATE 1 % EX SOLN: Freq: Two times a day (BID) | CUTANEOUS | 1 refills | Status: AC

## 2023-02-24 NOTE — Progress Notes (Signed)
Martha Small , November 07, 1996, 27 y.o., female MRN: [id number] Patient Care Team    Relationship Specialty Notifications Start End  Ma Hillock, DO PCP - General Family Medicine  05/16/19   Rutherford Guys, MD Consulting Physician Ophthalmology  05/16/19   Tamela Gammon, NP Nurse Practitioner Gynecology  12/03/22     Chief Complaint  Patient presents with   Rash    Both armpits; onset July can be painful' came back on Sunday starting having pain on Monday. No creams or meds taken; steroid cream used a few months ago     Subjective: Martha Small is a 27 y.o. Pt presents for an OV with complaints of rash of axilla of that occurred on Sunday . She reports similar rash intermittently over the last year. Associated symptoms include pain. Patient has a history of eczema Pt has tried steroid cream and changing body washes and deodorants  to ease their symptoms.      02/23/2022    3:48 PM 01/12/2022    9:56 AM 06/19/2020    1:50 PM 05/16/2019   11:08 AM  Depression screen PHQ 2/9  Decreased Interest 0 0 0 0  Down, Depressed, Hopeless 0 0 0 0  PHQ - 2 Score 0 0 0 0  Altered sleeping   0   Tired, decreased energy   0   Change in appetite   0   Feeling bad or failure about yourself    0   Trouble concentrating   0   Moving slowly or fidgety/restless   0   Suicidal thoughts   0   PHQ-9 Score   0   Difficult doing work/chores   Not difficult at all     No Known Allergies Social History   Social History Narrative   Marital status/children/pets: Single.    Education/employment: Electronics engineer.    Safety:      -Wears a bicycle helmet riding a bike: Yes     -smoke alarm in the home:Yes     - wears seatbelt: Yes     - Feels safe in their relationships: Yes   Past Medical History:  Diagnosis Date   Myopia of both eyes    Past Surgical History:  Procedure Laterality Date   IUD REMOVAL     LASIK Bilateral 06/2021   WISDOM TOOTH EXTRACTION     Family History   Problem Relation Age of Onset   Breast cancer Mother 43   Hypertension Mother    Skin cancer Mother    Hyperlipidemia Father    Hypertension Father    Arthritis Maternal Grandmother    Skin cancer Maternal Grandfather    Arthritis Paternal Grandmother    Diabetes Paternal Grandmother    Prostate cancer Paternal Grandfather    Diabetes Paternal Grandfather    Hypertension Paternal Grandfather    Hyperlipidemia Paternal Grandfather    Heart disease Paternal Grandfather    Allergies as of 02/24/2023   No Known Allergies      Medication List        Accurate as of February 24, 2023 11:13 AM. If you have any questions, ask your nurse or doctor.          clindamycin 1 % external solution Commonly known as: CLEOCIN T Apply topically 2 (two) times daily for 10 days. Started by: Howard Pouch, DO   MULTIVITAMIN PO Take by mouth.        All past medical history, surgical history,  allergies, family history, immunizations andmedications were updated in the EMR today and reviewed under the history and medication portions of their EMR.     ROS Negative, with the exception of above mentioned in HPI   Objective:  BP (!) 112/56   Pulse (!) 54   Temp 99 F (37.2 C)   Wt 128 lb 9.6 oz (58.3 kg)   LMP 02/03/2023   SpO2 98%   BMI 22.42 kg/m  Body mass index is 22.42 kg/m.  Physical Exam Vitals and nursing note reviewed.  Constitutional:      General: She is not in acute distress.    Appearance: Normal appearance. She is normal weight. She is not ill-appearing or toxic-appearing.  HENT:     Head: Normocephalic and atraumatic.  Eyes:     General: No scleral icterus.       Right eye: No discharge.        Left eye: No discharge.     Extraocular Movements: Extraocular movements intact.     Conjunctiva/sclera: Conjunctivae normal.     Pupils: Pupils are equal, round, and reactive to light.  Skin:    Findings: Rash (folliculitis right axilla and left, erythemic rash left  axilla) present.  Neurological:     Mental Status: She is alert and oriented to person, place, and time. Mental status is at baseline.     Motor: No weakness.     Coordination: Coordination normal.     Gait: Gait normal.  Psychiatric:        Mood and Affect: Mood normal.        Behavior: Behavior normal.        Thought Content: Thought content normal.        Judgment: Judgment normal.      No results found. No results found. No results found for this or any previous visit (from the past 24 hour(s)).  Assessment/Plan: Martha Small is a 27 y.o. female present for OV for  Trichomycosis axillaris Cleansing with hibiclens.  Cleocin solution application BID x 10 days. Then prn Replace razors after each shave until completely resolved.  F/u prn  Reviewed expectations re: course of current medical issues. Discussed self-management of symptoms. Outlined signs and symptoms indicating need for more acute intervention. Patient verbalized understanding and all questions were answered. Patient received an After-Visit Summary.    No orders of the defined types were placed in this encounter.  Meds ordered this encounter  Medications   clindamycin (CLEOCIN T) 1 % external solution    Sig: Apply topically 2 (two) times daily for 10 days.    Dispense:  30 mL    Refill:  1   Referral Orders  No referral(s) requested today     Note is dictated utilizing voice recognition software. Although note has been proof read prior to signing, occasional typographical errors still can be missed. If any questions arise, please do not hesitate to call for verification.   electronically signed by:  Howard Pouch, DO  [place]

## 2023-02-24 NOTE — Patient Instructions (Addendum)
Return if symptoms worsen or fail to improve.        Great to see you today.  I have refilled the medication(s) we provide.   If labs were collected, we will inform you of lab results once received either by echart message or telephone call.   - echart message- for normal results that have been seen by the patient already.   - telephone call: abnormal results or if patient has not viewed results in their echart.   Trichomycosis axillaris

## 2023-03-09 DIAGNOSIS — Z111 Encounter for screening for respiratory tuberculosis: Secondary | ICD-10-CM | POA: Diagnosis not present

## 2023-05-06 DIAGNOSIS — D2261 Melanocytic nevi of right upper limb, including shoulder: Secondary | ICD-10-CM | POA: Diagnosis not present

## 2023-05-06 DIAGNOSIS — D225 Melanocytic nevi of trunk: Secondary | ICD-10-CM | POA: Diagnosis not present

## 2023-05-06 DIAGNOSIS — L700 Acne vulgaris: Secondary | ICD-10-CM | POA: Diagnosis not present

## 2023-05-06 DIAGNOSIS — L738 Other specified follicular disorders: Secondary | ICD-10-CM | POA: Diagnosis not present

## 2023-07-14 ENCOUNTER — Ambulatory Visit (INDEPENDENT_AMBULATORY_CARE_PROVIDER_SITE_OTHER): Payer: Federal, State, Local not specified - PPO | Admitting: Nurse Practitioner

## 2023-07-14 ENCOUNTER — Encounter: Payer: Self-pay | Admitting: Nurse Practitioner

## 2023-07-14 VITALS — BP 110/62 | HR 71 | Ht 62.25 in | Wt 125.0 lb

## 2023-07-14 DIAGNOSIS — Z01419 Encounter for gynecological examination (general) (routine) without abnormal findings: Secondary | ICD-10-CM | POA: Diagnosis not present

## 2023-07-14 NOTE — Progress Notes (Signed)
   Martha Small 1996/04/28 [id number]   History:  27 y.o. G0 presents for annual exam without GYN complaints. Monthly cycles. Normal pap history. Gardasil series completed.    Gynecologic History Patient's last menstrual period was 07/04/2023 (exact date). Period Duration (Days): 4 Period Pattern: Regular Menstrual Flow: Moderate Menstrual Control:  (menstrual disc) Dysmenorrhea: None Contraception/Family planning: rhythm method Sexually active: Yes, declines STD screening  Health Maintenance Last Pap: 06/11/2022. Results were: Normal Last mammogram: 04/24/2020 (right ultrasound). Results were: Normal Last colonoscopy: Not indicated Last Dexa: Not indicated  Past medical history, past surgical history, family history and social history were all reviewed and documented in the EPIC chart. In [employer] school, graduates 06/2024. Interested in [employer] health with focus in endocrinology. Boyfriend also in [place] school.   ROS:  A ROS was performed and pertinent positives and negatives are included.  Exam:  Vitals:   07/14/23 1409  BP: 110/62  Pulse: 71  SpO2: 99%  Weight: 125 lb (56.7 kg)  Height: 5' 2.25" (1.581 m)     Body mass index is 22.68 kg/m.  General appearance:  Normal Thyroid:  Symmetrical, normal in size, without palpable masses or nodularity. Respiratory  Auscultation:  Clear without wheezing or rhonchi Cardiovascular  Auscultation:  Regular rate, without rubs, murmurs or gallops  Edema/varicosities:  Not grossly evident Abdominal  Soft,nontender, without masses, guarding or rebound.  Liver/spleen:  No organomegaly noted  Hernia:  None appreciated  Skin  Inspection:  Grossly normal Breasts: Examined lying and sitting.   Right: Without masses, retractions, nipple discharge or axillary adenopathy.   Left: Without masses, retractions, nipple discharge or axillary adenopathy. Genitourinary   Inguinal/mons:  Normal without inguinal adenopathy  External genitalia:   Normal appearing vulva with no masses, tenderness, or lesions  BUS/Urethra/Skene's glands:  Normal  Vagina:  Normal appearing with normal color and discharge, no lesions  Cervix:  Normal appearing without discharge or lesions. IUD string visible  Uterus:  Normal in size, shape and contour.  Midline and mobile, nontender  Adnexa/parametria:     Rt: Normal in size, without masses or tenderness.   Lt: Normal in size, without masses or tenderness.  Anus and perineum: Normal  Digital rectal exam: Not indicated  Patient informed chaperone available to be present for breast and pelvic exam. Patient has requested no chaperone to be present. Patient has been advised what will be completed during breast and pelvic exam.   Assessment/Plan:  27 y.o. G0 for annual exam.   Well female exam with routine gynecological exam - Education provided on SBEs, importance of preventative screenings, current guidelines, high calcium diet, regular exercise, and multivitamin daily.   Screening for cervical cancer - Normal pap history. Will repeat at 3-year interval per guidelines.   Follow up in 1 year for annual.     [name] [facility], 2:31 PM 07/14/2023

## 2023-09-14 ENCOUNTER — Encounter: Payer: Self-pay | Admitting: Family Medicine

## 2023-09-15 NOTE — Telephone Encounter (Signed)
printed

## 2024-01-21 DIAGNOSIS — Z0189 Encounter for other specified special examinations: Secondary | ICD-10-CM | POA: Diagnosis not present

## 2024-07-03 ENCOUNTER — Encounter: Payer: Self-pay | Admitting: Family Medicine

## 2024-07-03 ENCOUNTER — Telehealth: Admitting: Physician Assistant

## 2024-07-03 DIAGNOSIS — Z9189 Other specified personal risk factors, not elsewhere classified: Secondary | ICD-10-CM

## 2024-07-03 DIAGNOSIS — W57XXXA Bitten or stung by nonvenomous insect and other nonvenomous arthropods, initial encounter: Secondary | ICD-10-CM

## 2024-07-03 DIAGNOSIS — S1086XA Insect bite of other specified part of neck, initial encounter: Secondary | ICD-10-CM

## 2024-07-03 MED ORDER — DOXYCYCLINE HYCLATE 100 MG PO TABS: 200.0000 mg | ORAL_TABLET | Freq: Once | ORAL | 0 refills | Status: AC

## 2024-07-03 NOTE — Patient Instructions (Signed)
 Martha Small, thank you for joining Delon CHRISTELLA Dickinson, PA-C for today's virtual visit.  While this provider is not your primary care provider (PCP), if your PCP is located in our provider database this encounter information will be shared with them immediately following your visit.   A [facility] MyChart account gives you access to today's visit and all your visits, tests, and labs performed at [facility]  click here if you don't have a [facility] MyChart account or go to [email or web address]  Consent: (Patient) Martha Small provided verbal consent for this virtual visit at the beginning of the encounter.  Current Medications:  Current Outpatient Medications:    doxycycline  (VIBRA -TABS) 100 MG tablet, Take 2 tablets (200 mg total) by mouth once for 1 dose., Disp: 2 tablet, Rfl: 0   Multiple Vitamin (MULTIVITAMIN PO), Take by mouth., Disp: , Rfl:    Medications ordered in this encounter:  Meds ordered this encounter  Medications   doxycycline  (VIBRA -TABS) 100 MG tablet    Sig: Take 2 tablets (200 mg total) by mouth once for 1 dose.    Dispense:  2 tablet    Refill:  0    Supervising Provider:   LAMPTEY, PHILIP O [[id number]]     *If you need refills on other medications prior to your next appointment, please contact your pharmacy*  Follow-Up: Call back or seek an in-person evaluation if the symptoms worsen or if the condition fails to improve as anticipated.  [facility] [phone]  Other Instructions  Tick Bite Information, Adult  Ticks are insects that draw blood for food. They climb onto people and animals that brush against the leaves and grasses that they live in. They then bite and attach to the skin. Most ticks are harmless, but some ticks may carry germs that can cause disease. These germs are spread to a person through a bite. To lower your risk of getting a disease from a tick bite, make sure you: Take steps to prevent tick  bites. Check for ticks after being outdoors where ticks live. Watch for symptoms of disease if a tick attached to you or if you think a tick bit you. How can I prevent tick bites? Take these steps to help prevent tick bites when you go outdoors in an area where ticks live: Before you go outdoors: Wear long sleeves and long pants to protect your skin from ticks. Wear light-colored clothing so you can see ticks easier. Tuck your pant legs into your socks. Apply insect repellent that has DEET (20% or higher), picaridin, or IR3535 in it to the following areas: Any bare skin. Avoid areas around the eyes and mouth. Edges of clothing, like the top of your boots, the bottom of your pant legs, and your sleeve cuffs. Consider applying an insect repellant that contains permethrin. Follow the instructions on the label. Do not apply permethrin directly to the skin. Instead, apply to the following areas: Clothing and shoes. Outdoor gear and tents. When you are outdoors: Avoid walking through areas with long grass. If you are walking on a trail, stay in the middle of the trail so your skin, hair, and clothing do not touch the bushes. Check for ticks on your clothing, hair, and skin often while you are outdoors. Check again before you go inside. When you go indoors: Check your clothing for ticks. Tumble dry clothes in a dryer on high heat for at least 10 minutes. If clothes are damp, additional time may be  needed. If clothes require washing, use hot water. Check your gear and pets. Shower soon after being outdoors. Check your body for ticks. Do a full body check using a mirror. Be sure to check your scalp, neck, armpits, waist, groin, and joint areas. These are the spots where ticks attach themselves most often. What is the best way to remove a tick?  Remove the tick as soon as possible. Removing it can prevent germs from passing to your body. Do not remove the tick with your bare fingers. Do not try to  remove a tick with heat, alcohol, petroleum jelly, or fingernail polish. These things can cause the tick to salivate and regurgitate into your bloodstream, increasing your risk of getting a disease. To remove a tick that is crawling on your skin: Go outside and brush the tick off. Use tape or a lint roller. To remove a tick that is attached to your skin: Wash your hands. If you have gloves, put them on. Use a fine-tipped tweezer, curved forceps, or a tick-removal tool to gently grasp the tick as close to your skin and the tick's head as possible. Gently pull with a steady, upward, and even pressure until the tick lets go. While removing the tick: Take care to keep the tick's head attached to its body. Do not twist or jerk the tick. This can make the tick's head or mouth parts break off and stay in your skin. If this happens, try to remove the mouth parts with tweezers. If you cannot remove them, leave the area alone and let the skin heal. Do not squeeze or crush the tick's body. This could force disease-carrying fluids from the tick into your body. What should I do after removing a tick? Clean the bite area and your hands with soap and water, rubbing alcohol, or an iodine scrub. If an antiseptic cream or ointment is available, put a small amount on the bite area. Wash and disinfect any tools that you used to remove the tick. How should I dispose of a tick? To dispose of a live tick, use one of these methods: Place it in rubbing alcohol. Place it in a sealed bag or container, and throw it away. Wrap it tightly in tape, and throw it away. Flush it down the toilet. Where to find more information Centers for Disease Control and Prevention: GyrateAtrophy.si U.S. Environmental Protection Agency: RelocationNetworking.fi Contact a health care provider if: You have symptoms of a disease after a tick bite. Symptoms of a tick-borne disease can occur from moments after the tick bites to 30 days after a  tick is removed. Symptoms include: Fever or chills. A red rash that makes a circle (bull's-eye rash) in the bite area. Redness and swelling in the bite area. Headache or stiff neck. Muscle, joint, or bone pain. Abnormal tiredness. Numbness in your legs or trouble walking or moving your legs. Tender or swollen lymph glands. Abdominal pain, vomiting, diarrhea, or weight loss. Get help right away if: You are not able to remove a tick. You have muscle weakness or paralysis. Your symptoms get worse or you experience new symptoms. You find an engorged tick on your skin and you are in an area where there is a higher risk of disease from ticks. Summary Ticks may carry germs that can spread to a person through a bite. These germs can cause disease. Wear protective clothing and use insect repellent to prevent tick bites. Follow the instructions on the label. If you find a tick  on your body, remove it as soon as possible. If the tick is attached, do not try to remove it with heat, alcohol, petroleum jelly, or fingernail polish. If you have symptoms of a disease after being bitten by a tick, contact a health care provider. This information is not intended to replace advice given to you by your health care provider. Make sure you discuss any questions you have with your health care provider. Document Revised: 02/16/2022 Document Reviewed: 02/16/2022 Elsevier Patient Education  2024 Elsevier Inc.   If you have been instructed to have an in-person evaluation today at a local Urgent Care facility, please use the link below. It will take you to a list of all of our available [facility], including address, phone number and hours of operation. Please do not delay care.  [facility]  If you or a family member do not have a primary care provider, use the link below to schedule a visit and establish care. When you choose a [facility] primary care physician or advanced practice  provider, you gain a long-term partner in health. Find a Primary Care Provider  Learn more about [facility]'s in-office and virtual care options: [facility] - Get Care Now

## 2024-07-03 NOTE — Progress Notes (Signed)
 Virtual Visit Consent   Martha Small, you are scheduled for a virtual visit with a [facility] provider today. Just as with appointments in the office, your consent must be obtained to participate. Your consent will be active for this visit and any virtual visit you may have with one of our providers in the next 365 days. If you have a MyChart account, a copy of this consent can be sent to you electronically.  As this is a virtual visit, video technology does not allow for your provider to perform a traditional examination. This may limit your provider's ability to fully assess your condition. If your provider identifies any concerns that need to be evaluated in person or the need to arrange testing (such as labs, EKG, etc.), we will make arrangements to do so. Although advances in technology are sophisticated, we cannot ensure that it will always work on either your end or our end. If the connection with a video visit is poor, the visit may have to be switched to a telephone visit. With either a video or telephone visit, we are not always able to ensure that we have a secure connection.  By engaging in this virtual visit, you consent to the provision of healthcare and authorize for your insurance to be billed (if applicable) for the services provided during this visit. Depending on your insurance coverage, you may receive a charge related to this service.  I need to obtain your verbal consent now. Are you willing to proceed with your visit today? Martha Small has provided verbal consent on 07/03/2024 for a virtual visit (video or telephone). Delon CHRISTELLA Dickinson, PA-C  Date: 07/03/2024 4:33 PM   Virtual Visit via Video Note   I, Delon CHRISTELLA Dickinson, connected with  Martha Small  ([id number], [birth year]) on 07/03/24 at  4:30 PM EDT by a video-enabled telemedicine application and verified that I am speaking with the correct person using two identifiers.  Location: Patient: Virtual Visit  Location Patient: Home Provider: Virtual Visit Location Provider: Home Office   I discussed the limitations of evaluation and management by telemedicine and the availability of in person appointments. The patient expressed understanding and agreed to proceed.    History of Present Illness: Martha Small is a 28 y.o. who identifies as a female who was assigned female at birth, and is being seen today for tick bite. Tick was attached for an unknown duration. Unknown type of tick. She panicked when she found it and threw it away before checking. The tick was not engorged but was attached long enough for the head to have been embedded enough the scalp bled some when removed. The tick was just removed today. Currently no signs or symptoms of illness.    Problems:  Patient Active Problem List   Diagnosis Date Noted   Trichomycosis axillaris 02/24/2023   Neck mass 01/12/2022   Eczema 05/16/2019    Allergies: No Known Allergies Medications:  Current Outpatient Medications:    doxycycline  (VIBRA -TABS) 100 MG tablet, Take 2 tablets (200 mg total) by mouth once for 1 dose., Disp: 2 tablet, Rfl: 0   Multiple Vitamin (MULTIVITAMIN PO), Take by mouth., Disp: , Rfl:   Observations/Objective: Patient is well-developed, well-nourished in no acute distress.  Resting comfortably at home.  Head is normocephalic, atraumatic.  No labored breathing.  Speech is clear and coherent with logical content.  Patient is alert and oriented at baseline.    Assessment and Plan: 1. At high risk for tick borne illness (  Primary) - doxycycline  (VIBRA -TABS) 100 MG tablet; Take 2 tablets (200 mg total) by mouth once for 1 dose.  Dispense: 2 tablet; Refill: 0  2. Tick bite of other part of neck, initial encounter  - Since unknown duration of tick attachment will give prophylactic dose of Doxycycline  200mg  once - Monitor for any fevers, rashes, severe headaches, myalgia, flu-like symptoms, or allergies to foods -  Seek immediate evaluation if any signs or symptoms develop   Follow Up Instructions: I discussed the assessment and treatment plan with the patient. The patient was provided an opportunity to ask questions and all were answered. The patient agreed with the plan and demonstrated an understanding of the instructions.  A copy of instructions were sent to the patient via MyChart unless otherwise noted below.    The patient was advised to call back or seek an in-person evaluation if the symptoms worsen or if the condition fails to improve as anticipated.    Delon CHRISTELLA Dickinson, PA-C

## 2024-10-18 ENCOUNTER — Encounter: Payer: Self-pay | Admitting: Nurse Practitioner

## 2024-10-18 ENCOUNTER — Ambulatory Visit: Admitting: Nurse Practitioner

## 2024-10-18 VITALS — BP 114/76 | HR 68 | Ht 62.5 in | Wt 123.0 lb

## 2024-10-18 DIAGNOSIS — Z1331 Encounter for screening for depression: Secondary | ICD-10-CM

## 2024-10-18 DIAGNOSIS — Z01419 Encounter for gynecological examination (general) (routine) without abnormal findings: Secondary | ICD-10-CM | POA: Diagnosis not present

## 2024-10-18 DIAGNOSIS — N644 Mastodynia: Secondary | ICD-10-CM

## 2024-10-18 NOTE — Progress Notes (Signed)
 Martha Small 1996/08/30 [id number]   History:  28 y.o. G0 presents for annual exam. Complains of tenderness in right outer breast 2-3 days before menses each month. Pain subsides a few days after period starts. Had imaging done on same area in 2021 that was normal. Monthly cycles. Normal pap history. Gardasil series completed. Mother diagnosed with breast cancer around age 25.    Gynecologic History Patient's last menstrual period was 10/06/2024 (exact date). Period Duration (Days): 4 Period Pattern: Regular Menstrual Flow: Moderate, Light Menstrual Control:  (menstrual disc) Dysmenorrhea: None Contraception/Family planning: rhythm method Sexually active: Yes, declines STD screening  Health Maintenance Last Pap: 06/11/2022. Results were: Normal Last mammogram: 04/24/2020 (right ultrasound). Results were: Normal Last colonoscopy: Not indicated Last Dexa: Not indicated     10/18/2024    2:59 PM  Depression screen PHQ 2/9  Decreased Interest 0  Down, Depressed, Hopeless 0  PHQ - 2 Score 0     Past medical history, past surgical history, family history and social history were all reviewed and documented in the EPIC chart. Graduated from [employer] school in August. Will start in ER in a couple of weeks. Boyfriend in [place] school.   ROS:  A ROS was performed and pertinent positives and negatives are included.  Exam:  Vitals:   10/18/24 1459  BP: 114/76  Pulse: 68  SpO2: 97%  Weight: 123 lb (55.8 kg)  Height: 5' 2.5 (1.588 m)      Body mass index is 22.14 kg/m.  General appearance:  Normal Thyroid:  Symmetrical, normal in size, without palpable masses or nodularity. Respiratory  Auscultation:  Clear without wheezing or rhonchi Cardiovascular  Auscultation:  Regular rate, without rubs, murmurs or gallops  Edema/varicosities:  Not grossly evident Abdominal  Soft,nontender, without masses, guarding or rebound.  Liver/spleen:  No organomegaly noted  Hernia:  None  appreciated  Skin  Inspection:  Grossly normal Breasts: Examined lying and sitting.   Right: Without masses, retractions, nipple discharge or axillary adenopathy. + dense breast tissue in outer quadrants.   Left: Without masses, retractions, nipple discharge or axillary adenopathy. Pelvic: External genitalia:  no lesions              Urethra:  normal appearing urethra with no masses, tenderness or lesions              Bartholins and Skenes: normal                 Vagina: normal appearing vagina with normal color and discharge, no lesions              Cervix: no lesions Bimanual Exam:  Uterus:  no masses or tenderness              Adnexa: no mass, fullness, tenderness              Rectovaginal: Deferred              Anus:  normal, no lesions  Martha Small, CMA present as chaperone.   Assessment/Plan:  28 y.o. G0 for annual exam.   Well female exam with routine gynecological exam - Education provided on SBEs, importance of preventative screenings, current guidelines, high calcium diet, regular exercise, and multivitamin daily.   Depression screening - PHQ - 0  Cyclical breast pain - Complains of tenderness in right outer breast 2-3 days before menses each month. Pain subsides a few days after period starts. Had imaging done on same area in 2021 that was  normal. Normal exam today other than dense breast tissue in outer quadrants. Will monitor.   Screening for cervical cancer - Normal pap history. Will repeat at 3-year interval per guidelines.   Return in about 1 year (around 10/18/2025) for Annual.      Martha Small Martha Small, 3:25 PM 10/18/2024
